# Patient Record
Sex: Male | Born: 1950
Health system: Southern US, Community
[De-identification: ages and names within clinical notes are randomized; demographics above are authoritative.]

## PROBLEM LIST (undated history)

## (undated) DIAGNOSIS — E785 Hyperlipidemia, unspecified: Secondary | ICD-10-CM

## (undated) DIAGNOSIS — K5190 Ulcerative colitis, unspecified, without complications: Secondary | ICD-10-CM

## (undated) DIAGNOSIS — I10 Essential (primary) hypertension: Secondary | ICD-10-CM

## (undated) DIAGNOSIS — T7840XA Allergy, unspecified, initial encounter: Secondary | ICD-10-CM

## (undated) HISTORY — DX: Ulcerative colitis, unspecified, without complications: K51.90

## (undated) HISTORY — DX: Allergy, unspecified, initial encounter: T78.40XA

## (undated) HISTORY — DX: Hyperlipidemia, unspecified: E78.5

---

## 2011-08-22 ENCOUNTER — Emergency Department
Admission: EM | Admit: 2011-08-22 | Discharge: 2011-08-22 | Disposition: A | Discharge status: Home / Self Care | Payer: BC Managed Care – PPO | Source: Home / Self Care | Attending: Emergency Medicine | Admitting: Emergency Medicine

## 2011-08-22 ENCOUNTER — Encounter: Payer: Self-pay | Admitting: *Deleted

## 2011-08-22 DIAGNOSIS — J069 Acute upper respiratory infection, unspecified: Secondary | ICD-10-CM

## 2011-08-22 DIAGNOSIS — J329 Chronic sinusitis, unspecified: Secondary | ICD-10-CM

## 2011-08-22 MED ORDER — AMOXICILLIN-POT CLAVULANATE 875-125 MG PO TABS: 1.0000 | ORAL_TABLET | Freq: Two times a day (BID) | ORAL | Status: AC

## 2011-08-22 NOTE — ED Notes (Signed)
Patient c/o sinus pain, drainage and productive cough. Taken Advil cold and sinus and Claritin. Denies fever.

## 2011-08-22 NOTE — ED Provider Notes (Signed)
History     CSN: [id number]  Arrival date & time 08/22/11  4098   First MD Initiated Contact with Patient 08/22/11 0945      Chief Complaint  Patient presents with  . Facial Pain  . Cough    (Consider location/radiation/quality/duration/timing/severity/associated sxs/prior treatment) HPI Daniel Baldwin is a 61 y.o. male who complains of onset of cold symptoms for 7 days.  The symptoms are constant and mild-moderate in severity. No sore throat +cough No pleuritic pain No wheezing +nasal congestion + post-nasal drainage + sinus pain/pressure No chest congestion No itchy/red eyes No earache No hemoptysis No SOB No chills/sweats No fever No nausea No vomiting No abdominal pain No diarrhea No skin rashes No fatigue No myalgias +headache    History reviewed. No pertinent past medical history.  History reviewed. No pertinent past surgical history.  Family History  Problem Relation Age of Onset  . Hypertension Mother   . Hypertension Father     History  Substance Use Topics  . Smoking status: Former Smoker    Quit date: 08/21/2001  . Smokeless tobacco: Not on file  . Alcohol Use: Yes      Review of Systems  All other systems reviewed and are negative.    Allergies  Pollen extract  Home Medications   Current Outpatient Rx  Name Route Sig Dispense Refill  . ASPIRIN 81 MG PO TABS Oral Take 81 mg by mouth daily.    . AMOXICILLIN-POT CLAVULANATE 875-125 MG PO TABS Oral Take 1 tablet by mouth 2 (two) times daily. 16 tablet 0    BP 151/93  Pulse 94  Temp(Src) 98.7 F (37.1 C) (Oral)  Resp 16  Ht 5\' 10"  (1.778 m)  Wt 174 lb (78.926 kg)  BMI 24.97 kg/m2  SpO2 97%  Physical Exam  Nursing note and vitals reviewed. Constitutional: He is oriented to person, place, and time. He appears well-developed and well-nourished.  HENT:  Head: Normocephalic and atraumatic.  Right Ear: Tympanic membrane, external ear and ear canal normal.  Left Ear: Tympanic  membrane, external ear and ear canal normal.  Nose: Mucosal edema and rhinorrhea present.  Mouth/Throat: Posterior oropharyngeal erythema present. No oropharyngeal exudate or posterior oropharyngeal edema.  Eyes: No scleral icterus.  Neck: Neck supple.  Cardiovascular: Regular rhythm and normal heart sounds.   Pulmonary/Chest: Effort normal and breath sounds normal. No respiratory distress.  Neurological: He is alert and oriented to person, place, and time.  Skin: Skin is warm and dry.  Psychiatric: He has a normal mood and affect. His speech is normal.    ED Course  Procedures (including critical care time)  Labs Reviewed - No data to display No results found.   1. Acute upper respiratory infections of unspecified site   2. Sinusitis       MDM  1)  Take the prescribed antibiotic as instructed. 2)  Use nasal saline solution (over the counter) at least 3 times a day. 3)  Use over the counter decongestants like Zyrtec-D every 12 hours as needed to help with congestion.  If you have hypertension, do not take medicines with sudafed.  4)  Can take tylenol every 6 hours or motrin every 8 hours for pain or fever. 5)  Follow up with your primary doctor if no improvement in 5-7 days, sooner if increasing pain, fever, or new symptoms.     Marlaine Hind, MD 08/22/11 1005

## 2014-01-20 ENCOUNTER — Emergency Department (INDEPENDENT_AMBULATORY_CARE_PROVIDER_SITE_OTHER)
Admission: EM | Admit: 2014-01-20 | Discharge: 2014-01-20 | Disposition: A | Discharge status: Home / Self Care | Payer: BC Managed Care – PPO | Source: Home / Self Care

## 2014-01-20 ENCOUNTER — Encounter: Payer: Self-pay | Admitting: Emergency Medicine

## 2014-01-20 DIAGNOSIS — Z23 Encounter for immunization: Secondary | ICD-10-CM

## 2014-01-20 MED ORDER — INFLUENZA VAC SPLIT QUAD 0.5 ML IM SUSY: 0.5000 mL | PREFILLED_SYRINGE | INTRAMUSCULAR | Status: DC

## 2014-01-20 MED ORDER — INFLUENZA VAC SPLIT QUAD 0.5 ML IM SUSY
0.5000 mL | PREFILLED_SYRINGE | Freq: Once | INTRAMUSCULAR | Status: AC
  Administered 2014-01-20: 0.5 mL via INTRAMUSCULAR

## 2014-01-20 NOTE — ED Notes (Signed)
Pt is here for influenza vaccine. BP 174/111 and 177/118 but asymptomatic. Checked with Dr. Burnett Harry to ok vaccine administration. Advised not a contraindication. Made apt for him to establish care with Dr. Ileene Rubens tomorrow, 01/21/14 @ 1:15pm. Pt given apt time and location, verbalized understanding.

## 2014-01-21 ENCOUNTER — Ambulatory Visit (INDEPENDENT_AMBULATORY_CARE_PROVIDER_SITE_OTHER): Payer: BC Managed Care – PPO | Admitting: Family Medicine

## 2014-01-21 ENCOUNTER — Encounter: Payer: Self-pay | Admitting: Family Medicine

## 2014-01-21 VITALS — BP 169/106 | HR 82 | Ht 70.0 in | Wt 174.0 lb

## 2014-01-21 DIAGNOSIS — I10 Essential (primary) hypertension: Secondary | ICD-10-CM | POA: Insufficient documentation

## 2014-01-21 DIAGNOSIS — G5621 Lesion of ulnar nerve, right upper limb: Secondary | ICD-10-CM | POA: Insufficient documentation

## 2014-01-21 DIAGNOSIS — E785 Hyperlipidemia, unspecified: Secondary | ICD-10-CM | POA: Diagnosis not present

## 2014-01-21 HISTORY — DX: Hyperlipidemia, unspecified: E78.5

## 2014-01-21 NOTE — Progress Notes (Signed)
CC: Daniel Baldwin is a 63 y.o. male is here for Establish Care and Hypertension   Subjective: HPI:  Very pleasant 63 year old here to establish care with very limited past medical history.  Patient reports being told that his blood pressure was elevated earlier this week when he presented to our urgent care Center for a flu shot. Blood pressure was stage II hypertension he's never been told that blood pressure has ever been elevated in the past. Most recent blood pressure he can report beyond this was 2013 at a health fair at his job he was in the pre-hypertensive range. No formal exercise routine, 2 drinks of alcohol daily and currently no tobacco use.  He began exercising daily he found that his blood pressure was elevated with light jogging and walking briskly for 30 minutes without any chest pain headache nor motor or sensory disturbances. Strong family history of hypertension both sides of the family. He does not know his sodium intake.  Complains of right arm pain localized from the elbow distally involving the entire superficial aspect of the arm described as "hard to define"pain that is only noticed when resting his right elbow on a desk or fixed objects. Pain began the day after he was putting together cabnets in his garage requiring hours of manual screwing with his right hand. He estimates this occurred 2-3 months ago and pain has been mild but persistent ever since. He denies any overlying skin changes, weakness, nor focal pain. Nothing particularly makes the symptoms better. He's never had this before. He denies shoulder pain or neck pain  He brings in lipids from 2013 with an LDL of 183 he has never been on cholesterol medication  Review of Systems - General ROS: negative for - chills, fever, night sweats, weight gain or weight loss Ophthalmic ROS: negative for - decreased vision Psychological ROS: negative for - anxiety or depression ENT ROS: negative for - hearing change, nasal  congestion, tinnitus or allergies Hematological and Lymphatic ROS: negative for - bleeding problems, bruising or swollen lymph nodes Breast ROS: negative Respiratory ROS: no cough, shortness of breath, or wheezing Cardiovascular ROS: no chest pain or dyspnea on exertion Gastrointestinal ROS: no abdominal pain, change in bowel habits, or black or bloody stools Genito-Urinary ROS: negative for - genital discharge, genital ulcers, incontinence or abnormal bleeding from genitals Musculoskeletal ROS: negative for - joint pain or muscle pain other than that described above Neurological ROS: negative for - headaches or memory loss Dermatological ROS: negative for lumps, mole changes, rash and skin lesion changes  Past Medical History  Diagnosis Date  . Hyperlipidemia 01/21/2014    History reviewed. No pertinent past surgical history. Family History  Problem Relation Age of Onset  . Hypertension Mother   . Hypertension Father     History   Social History  . Marital Status: Married    Spouse Name: N/A    Number of Children: N/A  . Years of Education: N/A   Occupational History  . Not on file.   Social History Main Topics  . Smoking status: Former Smoker    Quit date: 08/21/2001  . Smokeless tobacco: Not on file  . Alcohol Use: Yes  . Drug Use: Not on file  . Sexual Activity: Not on file   Other Topics Concern  . Not on file   Social History Narrative  . No narrative on file     Objective: BP 169/106  Pulse 82  Ht 5\' 10"  (1.778 m)  Wt  174 lb (78.926 kg)  BMI 24.97 kg/m2  General: Alert and Oriented, No Acute Distress HEENT: Pupils equal, round, reactive to light. Conjunctivae clear.  Moist membranes pharynx unremarkable Lungs: Clear to auscultation bilaterally, no wheezing/ronchi/rales.  Comfortable work of breathing. Good air movement. Cardiac: Regular rate and rhythm. Normal S1/S2.  No murmurs, rubs, nor gallops. No carotid bruits  Extremities: No peripheral edema.   Strong peripheral pulses. Full range of motion and strength in the right upper extremity from the shoulder distally. C5 DTRs 2/5 bilaterally and symmetric. Examination of the right elbow shows no medial or lateral epicondylar pain with palpation. Resisted right wrist extension and flexion in elbow extension does not reproduce pain. No pain with resisted supination or pronation of the right forearm. Mental Status: No depression, anxiety, nor agitation. Skin: Warm and dry.  Assessment & Plan: Endi was seen today for establish care and hypertension.  Diagnoses and associated orders for this visit:  Essential hypertension  Ulnar neuropathy of right upper extremity  Hyperlipidemia    Essential hypertension: Uncontrolled, discussed new formal diagnosis.  He is extremely apprehensive about taking medications for this.  He wants to aggressively focus on dietary and exercise interventions for the time being. We discussed thedash diet, sodium restriction to below 2000 mg daily, moderate exercise most days of the week described as 30 minutes of cardio at a exertion that he could still keep a conversation at. He's convinced that he has white coat hypertension therefore asked him to buy a blood pressure cuff and keep readings over the next 2 weeks and bring these numbers with his blood pressure cuff at his next visit in 2 weeks Right ulnar neuropathy, discussed home rehabilitative exercises for cubital tunnel syndrome to be performed on a daily basis for the next 4 weeks. If no improvement will refer for consideration of nerve conduction studies. Hyperlipidemia: Encouraged returning in for complete physical exam at that time we will obtain fasting lipids with a collection of other labs  Return in about 2 weeks (around 02/04/2014).

## 2014-01-21 NOTE — Patient Instructions (Signed)
DASH Eating Plan °DASH stands for "Dietary Approaches to Stop Hypertension." The DASH eating plan is a healthy eating plan that has been shown to reduce high blood pressure (hypertension). Additional health benefits may include reducing the risk of type 2 diabetes mellitus, heart disease, and stroke. The DASH eating plan may also help with weight loss. °WHAT DO I NEED TO KNOW ABOUT THE DASH EATING PLAN? °For the DASH eating plan, you will follow these general guidelines: °· Choose foods with a percent daily value for sodium of less than 5% (as listed on the food label). °· Use salt-free seasonings or herbs instead of table salt or sea salt. °· Check with your health care provider or pharmacist before using salt substitutes. °· Eat lower-sodium products, often labeled as "lower sodium" or "no salt added." °· Eat fresh foods. °· Eat more vegetables, fruits, and low-fat dairy products. °· Choose whole grains. Look for the word "whole" as the first word in the ingredient list. °· Choose fish and skinless chicken or turkey more often than red meat. Limit fish, poultry, and meat to 6 oz (170 g) each day. °· Limit sweets, desserts, sugars, and sugary drinks. °· Choose heart-healthy fats. °· Limit cheese to 1 oz (28 g) per day. °· Eat more home-cooked food and less restaurant, buffet, and fast food. °· Limit fried foods. °· Cook foods using methods other than frying. °· Limit canned vegetables. If you do use them, rinse them well to decrease the sodium. °· When eating at a restaurant, ask that your food be prepared with less salt, or no salt if possible. °WHAT FOODS CAN I EAT? °Seek help from a dietitian for individual calorie needs. °Grains °Whole grain or whole wheat bread. Brown rice. Whole grain or whole wheat pasta. Quinoa, bulgur, and whole grain cereals. Low-sodium cereals. Corn or whole wheat flour tortillas. Whole grain cornbread. Whole grain crackers. Low-sodium crackers. °Vegetables °Fresh or frozen vegetables  (raw, steamed, roasted, or grilled). Low-sodium or reduced-sodium tomato and vegetable juices. Low-sodium or reduced-sodium tomato sauce and paste. Low-sodium or reduced-sodium canned vegetables.  °Fruits °All fresh, canned (in natural juice), or frozen fruits. °Meat and Other Protein Products °Ground beef (85% or leaner), grass-fed beef, or beef trimmed of fat. Skinless chicken or turkey. Ground chicken or turkey. Pork trimmed of fat. All fish and seafood. Eggs. Dried beans, peas, or lentils. Unsalted nuts and seeds. Unsalted canned beans. °Dairy °Low-fat dairy products, such as skim or 1% milk, 2% or reduced-fat cheeses, low-fat ricotta or cottage cheese, or plain low-fat yogurt. Low-sodium or reduced-sodium cheeses. °Fats and Oils °Tub margarines without trans fats. Light or reduced-fat mayonnaise and salad dressings (reduced sodium). Avocado. Safflower, olive, or canola oils. Natural peanut or almond butter. °Other °Unsalted popcorn and pretzels. °The items listed above may not be a complete list of recommended foods or beverages. Contact your dietitian for more options. °WHAT FOODS ARE NOT RECOMMENDED? °Grains °White bread. White pasta. White rice. Refined cornbread. Bagels and croissants. Crackers that contain trans fat. °Vegetables °Creamed or fried vegetables. Vegetables in a cheese sauce. Regular canned vegetables. Regular canned tomato sauce and paste. Regular tomato and vegetable juices. °Fruits °Dried fruits. Canned fruit in light or heavy syrup. Fruit juice. °Meat and Other Protein Products °Fatty cuts of meat. Ribs, chicken wings, bacon, sausage, bologna, salami, chitterlings, fatback, hot dogs, bratwurst, and packaged luncheon meats. Salted nuts and seeds. Canned beans with salt. °Dairy °Whole or 2% milk, cream, half-and-half, and cream cheese. Whole-fat or sweetened yogurt. Full-fat   cheeses or blue cheese. Nondairy creamers and whipped toppings. Processed cheese, cheese spreads, or cheese  curds. °Condiments °Onion and garlic salt, seasoned salt, table salt, and sea salt. Canned and packaged gravies. Worcestershire sauce. Tartar sauce. Barbecue sauce. Teriyaki sauce. Soy sauce, including reduced sodium. Steak sauce. Fish sauce. Oyster sauce. Cocktail sauce. Horseradish. Ketchup and mustard. Meat flavorings and tenderizers. Bouillon cubes. Hot sauce. Tabasco sauce. Marinades. Taco seasonings. Relishes. °Fats and Oils °Butter, stick margarine, lard, shortening, ghee, and bacon fat. Coconut, palm kernel, or palm oils. Regular salad dressings. °Other °Pickles and olives. Salted popcorn and pretzels. °The items listed above may not be a complete list of foods and beverages to avoid. Contact your dietitian for more information. °WHERE CAN I FIND MORE INFORMATION? °[employer], [employer], [employer]: www.nhlbi.nih.gov/health/health-topics/topics/dash/ °Document Released: 03/16/2011 Document Revised: 08/11/2013 Document Reviewed: 01/29/2013 °Exit[employer]® Patient Information ©2015 [employer]. This information is not intended to replace advice given to you by your health care provider. Make sure you discuss any questions you have with your health care provider. ° °

## 2014-02-03 ENCOUNTER — Encounter: Payer: Self-pay | Admitting: Family Medicine

## 2014-03-11 ENCOUNTER — Telehealth: Payer: Self-pay | Admitting: Family Medicine

## 2014-03-11 NOTE — Telephone Encounter (Signed)
Pt.notified

## 2014-03-11 NOTE — Telephone Encounter (Signed)
Daniel Baldwin, You please think the patient for bringing in his blood pressure diary. I'm very impressed with his ability to reduce his blood pressure and for all of November it's been in the normal range. There is no reason for blood pressure medication at this point as long as he can keep up the good work. Follow-up in 3 months.

## 2014-03-12 ENCOUNTER — Ambulatory Visit (INDEPENDENT_AMBULATORY_CARE_PROVIDER_SITE_OTHER): Payer: BLUE CROSS/BLUE SHIELD | Admitting: Family Medicine

## 2014-03-12 ENCOUNTER — Encounter: Payer: Self-pay | Admitting: Family Medicine

## 2014-03-12 VITALS — BP 165/102 | HR 84 | Temp 98.2°F

## 2014-03-12 DIAGNOSIS — Z23 Encounter for immunization: Secondary | ICD-10-CM | POA: Diagnosis not present

## 2014-03-12 NOTE — Progress Notes (Signed)
   Subjective:    Patient ID: Daniel Baldwin, male    DOB: 24-Jan-1951, 63 y.o.   MRN: [id number]  HPI  Daniel Baldwin reports today for Tdap and Shingles vaccines. Denies recent illness or fever. He was counseled on possible side effects and verbalized understanding. His BP was elevated once again and he said that it is always like that and he is working on lowering naturally himself. He denies CP, SOB, abdominal pain, dizziness or headache. Margette Fast, CMA    Review of Systems     Objective:   Physical Exam        Assessment & Plan:

## 2014-08-07 ENCOUNTER — Emergency Department
Admission: EM | Admit: 2014-08-07 | Discharge: 2014-08-07 | Disposition: A | Discharge status: Home / Self Care | Payer: BLUE CROSS/BLUE SHIELD | Source: Home / Self Care | Attending: Family Medicine | Admitting: Family Medicine

## 2014-08-07 ENCOUNTER — Encounter: Payer: Self-pay | Admitting: *Deleted

## 2014-08-07 DIAGNOSIS — W5501XA Bitten by cat, initial encounter: Secondary | ICD-10-CM | POA: Diagnosis not present

## 2014-08-07 DIAGNOSIS — L03116 Cellulitis of left lower limb: Secondary | ICD-10-CM

## 2014-08-07 HISTORY — DX: Essential (primary) hypertension: I10

## 2014-08-07 MED ORDER — AMOXICILLIN-POT CLAVULANATE 875-125 MG PO TABS: 1.0000 | ORAL_TABLET | Freq: Two times a day (BID) | ORAL | Status: DC

## 2014-08-07 NOTE — ED Provider Notes (Signed)
CSN: [id number]     Arrival date & time 08/07/14  1058 History   First MD Initiated Contact with Patient 08/07/14 1134     Chief Complaint  Patient presents with  . cat scratch       HPI Comments: Patient's two male cats were fighting 10 days ago, and bit him on his left pretibial area and right posterior thigh above the knee.  Cats were adequately immunized and not ill. Patient has had a persistent draining lesion on left lower leg.  Bite on his right posterior calf has remained raised and nodular but is nontender.  He feels well otherwise.  No fevers, chills, and sweats.  His Tdap is current.  Patient is a 65 y.o. male presenting with animal bite. The history is provided by the patient.  Animal Bite Contact animal:  Cat Animal bite location: left pretibial area and right posterior calf. Time since incident:  10 days Pain details:    Quality:  Aching   Severity:  Mild   Progression:  Improving Provoked: unprovoked   Animal's rabies vaccination status:  Up to date Animal in possession: yes   Tetanus status:  Up to date Relieved by:  None tried Worsened by:  Nothing tried Ineffective treatments:  None tried Associated symptoms: swelling   Associated symptoms: no fever, no numbness and no rash     Past Medical History  Diagnosis Date  . Hyperlipidemia 01/21/2014  . Hypertension    History reviewed. No pertinent past surgical history. Family History  Problem Relation Age of Onset  . Hypertension Mother   . Hypertension Father    History  Substance Use Topics  . Smoking status: Former Smoker    Quit date: 08/21/2001  . Smokeless tobacco: Not on file  . Alcohol Use: Yes    Review of Systems  Constitutional: Negative for fever.  Skin: Negative for rash.  Neurological: Negative for numbness.  All other systems reviewed and are negative.   Allergies  Pollen extract  Home Medications   Prior to Admission medications   Medication Sig Start Date End Date Taking?  Authorizing Provider  aspirin 81 MG tablet Take 81 mg by mouth daily.   Yes Historical Provider, MD  amoxicillin-clavulanate (AUGMENTIN) 875-125 MG per tablet Take 1 tablet by mouth 2 (two) times daily. Take with food 08/07/14   Kandra Nicolas, MD   BP 181/98 mmHg  Pulse 84  Temp(Src) 98.2 F (36.8 C) (Oral)  Resp 14  Wt 174 lb (78.926 kg)  SpO2 99% Physical Exam  Constitutional: He is oriented to person, place, and time. He appears well-developed and well-nourished. No distress.  HENT:  Head: Normocephalic.  Eyes: Pupils are equal, round, and reactive to light.  Musculoskeletal:       Left lower leg: He exhibits tenderness.       Legs: Left pretibial area has a 28mm puncture wound with small amount of bloody drainage present, surrounded by erythema with a diameter 5cm.  The area is not fluctuant, and has minimal tenderness.  The right posterior thigh has a 20mm dia open wound without drainage.  The surrounding area is raised and nodular, about 2cm diameter, but nontender.  Neurological: He is alert and oriented to person, place, and time.  Skin: Skin is warm and dry.  Nursing note and vitals reviewed.   ED Course  Procedures  None   Labs Reviewed  WOUND CULTURE  WOUND CULTURE         MDM  1. Cat bite, initial encounter; nodularity of lesion right posterior thigh may represent healing hematoma.   2. Cellulitis of left lower leg    Wound culture pending from both sites Begin Augmentin 875mg  BID to cover pasteurella multocida. Keep wounds bandaged and change dressings daily until healed. Followup with Family Doctor if not improved in one week.     Kandra Nicolas, MD 08/07/14 1159

## 2014-08-07 NOTE — Discharge Instructions (Signed)
Keep wounds bandaged and change dressings daily until healed.   Animal Bite An animal bite can result in a scratch on the skin, deep open cut, puncture of the skin, crush injury, or tearing away of the skin or a body part. Dogs are responsible for most animal bites. Children are bitten more often than adults. An animal bite can range from very mild to more serious. A small bite from your house pet is no cause for alarm. However, some animal bites can become infected or injure a bone or other tissue. You must seek medical care if:  The skin is broken and bleeding does not slow down or stop after 15 minutes.  The puncture is deep and difficult to clean (such as a cat bite).  Pain, warmth, redness, or pus develops around the wound.  The bite is from a stray animal or rodent. There may be a risk of rabies infection.  The bite is from a snake, raccoon, skunk, fox, coyote, or bat. There may be a risk of rabies infection.  The person bitten has a chronic illness such as diabetes, liver disease, or cancer, or the person takes medicine that lowers the immune system.  There is concern about the location and severity of the bite. It is important to clean and protect an animal bite wound right away to prevent infection. Follow these steps:  Clean the wound with plenty of water and soap.  Apply an antibiotic cream.  Apply gentle pressure over the wound with a clean towel or gauze to slow or stop bleeding.  Elevate the affected area above the heart to help stop any bleeding.  Seek medical care. Getting medical care within 8 hours of the animal bite leads to the best possible outcome. DIAGNOSIS  Your caregiver will most likely:  Take a detailed history of the animal and the bite injury.  Perform a wound exam.  Take your medical history. Blood tests or X-rays may be performed. Sometimes, infected bite wounds are cultured and sent to a lab to identify the infectious bacteria.  TREATMENT    Medical treatment will depend on the location and type of animal bite as well as the patient's medical history. Treatment may include:  Wound care, such as cleaning and flushing the wound with saline solution, bandaging, and elevating the affected area.  Antibiotics.  Tetanus immunization.  Rabies immunization.  Leaving the wound open to heal. This is often done with animal bites, due to the high risk of infection. However, in certain cases, wound closure with stitches, wound adhesive, skin adhesive strips, or staples may be used. Infected bites that are left untreated may require intravenous (IV) antibiotics and surgical treatment in the hospital. [place]  Follow your caregiver's instructions for wound care.  Take all medicines as directed.  If your caregiver prescribes antibiotics, take them as directed. Finish them even if you start to feel better.  Follow up with your caregiver for further exams or immunizations as directed. You may need a tetanus shot if:  You cannot remember when you had your last tetanus shot.  You have never had a tetanus shot.  The injury broke your skin. If you get a tetanus shot, your arm may swell, get red, and feel warm to the touch. This is common and not a problem. If you need a tetanus shot and you choose not to have one, there is a rare chance of getting tetanus. Sickness from tetanus can be serious. SEEK MEDICAL CARE IF:  You notice warmth, redness, soreness, swelling, pus discharge, or a bad smell coming from the wound.  You have a red line on the skin coming from the wound.  You have a fever, chills, or a general ill feeling.  You have nausea or vomiting.  You have continued or worsening pain.  You have trouble moving the injured part.  You have other questions or concerns. MAKE SURE YOU:  Understand these instructions.  Will watch your condition.  Will get help right away if you are not doing well or get  worse. Document Released: 12/13/2010 Document Revised: 06/19/2011 Document Reviewed: 12/13/2010 [facility] Patient Information 2015 [place], [place]. This information is not intended to replace advice given to you by your health care provider. Make sure you discuss any questions you have with your health care provider.   Cellulitis Cellulitis is an infection of the skin and the tissue beneath it. The infected area is usually red and tender. Cellulitis occurs most often in the arms and lower legs.  CAUSES  Cellulitis is caused by bacteria that enter the skin through cracks or cuts in the skin. The most common types of bacteria that cause cellulitis are staphylococci and streptococci. SIGNS AND SYMPTOMS   Redness and warmth.  Swelling.  Tenderness or pain.  Fever. DIAGNOSIS  Your health care provider can usually determine what is wrong based on a physical exam. Blood tests may also be done. TREATMENT  Treatment usually involves taking an antibiotic medicine. HOME CARE INSTRUCTIONS   Take your antibiotic medicine as directed by your health care provider. Finish the antibiotic even if you start to feel better.  Keep the infected arm or leg elevated to reduce swelling.  Apply a warm cloth to the affected area up to 4 times per day to relieve pain.  Take medicines only as directed by your health care provider.  Keep all follow-up visits as directed by your health care provider. SEEK MEDICAL CARE IF:   You notice red streaks coming from the infected area.  Your red area gets larger or turns dark in color.  Your bone or joint underneath the infected area becomes painful after the skin has healed.  Your infection returns in the same area or another area.  You notice a swollen bump in the infected area.  You develop new symptoms.  You have a fever. SEEK IMMEDIATE MEDICAL CARE IF:   You feel very sleepy.  You develop vomiting or diarrhea.  You have a general ill feeling  (malaise) with muscle aches and pains. MAKE SURE YOU:   Understand these instructions.  Will watch your condition.  Will get help right away if you are not doing well or get worse. Document Released: 01/04/2005 Document Revised: 08/11/2013 Document Reviewed: 06/12/2011 [facility] Patient Information 2015 [place], [place]. This information is not intended to replace advice given to you by your health care provider. Make sure you discuss any questions you have with your health care provider.

## 2014-08-07 NOTE — ED Notes (Addendum)
Pts own cat scratched him on his left lower leg and posterior right upper thigh 10 days ago. Reports cat is UTD on all vaccines. He denies pain and is asymptomatic. Pts last tetanus vaccine 03/12/14

## 2014-08-09 ENCOUNTER — Telehealth: Payer: Self-pay | Admitting: Emergency Medicine

## 2014-08-09 NOTE — ED Notes (Signed)
Inquired about patient's status; encourage them to call with questions/concerns.  

## 2014-08-10 LAB — WOUND CULTURE
GRAM STAIN: NONE SEEN
GRAM STAIN: NONE SEEN
Gram Stain: NONE SEEN
Gram Stain: NONE SEEN
Gram Stain: NONE SEEN
ORGANISM ID, BACTERIA: NO GROWTH

## 2016-03-28 ENCOUNTER — Encounter: Payer: Self-pay | Admitting: Physician Assistant

## 2016-03-28 ENCOUNTER — Ambulatory Visit (INDEPENDENT_AMBULATORY_CARE_PROVIDER_SITE_OTHER): Payer: BLUE CROSS/BLUE SHIELD | Admitting: Physician Assistant

## 2016-03-28 VITALS — BP 169/104 | HR 77 | Ht 70.0 in | Wt 175.0 lb

## 2016-03-28 DIAGNOSIS — Z131 Encounter for screening for diabetes mellitus: Secondary | ICD-10-CM

## 2016-03-28 DIAGNOSIS — K921 Melena: Secondary | ICD-10-CM | POA: Diagnosis not present

## 2016-03-28 DIAGNOSIS — I10 Essential (primary) hypertension: Secondary | ICD-10-CM | POA: Diagnosis not present

## 2016-03-28 DIAGNOSIS — Z13220 Encounter for screening for lipoid disorders: Secondary | ICD-10-CM

## 2016-03-28 DIAGNOSIS — R197 Diarrhea, unspecified: Secondary | ICD-10-CM

## 2016-03-28 MED ORDER — LISINOPRIL 10 MG PO TABS: 10.0000 mg | ORAL_TABLET | Freq: Every day | ORAL | 0 refills | Status: DC

## 2016-03-28 NOTE — Patient Instructions (Addendum)
Will get scheduled for colonscopy.  Follow up BP in 1 month- nurse visit.   Lisinopril tablets What is this medicine? LISINOPRIL (lyse IN oh pril) is an ACE inhibitor. This medicine is used to treat high blood pressure and heart failure. It is also used to protect the heart immediately after a heart attack. This medicine may be used for other purposes; ask your health care provider or pharmacist if you have questions. COMMON BRAND NAME(S): Prinivil, Zestril What should I tell my health care provider before I take this medicine? They need to know if you have any of these conditions: -diabetes -heart or blood vessel disease -kidney disease -low blood pressure -previous swelling of the tongue, face, or lips with difficulty breathing, difficulty swallowing, hoarseness, or tightening of the throat -an unusual or allergic reaction to lisinopril, other ACE inhibitors, insect venom, foods, dyes, or preservatives -pregnant or trying to get pregnant -breast-feeding How should I use this medicine? Take this medicine by mouth with a glass of water. Follow the directions on your prescription label. You may take this medicine with or without food. If it upsets your stomach, take it with food. Take your medicine at regular intervals. Do not take it more often than directed. Do not stop taking except on your doctor's advice. Talk to your pediatrician regarding the use of this medicine in children. Special care may be needed. While this drug may be prescribed for children as young as 82 years of age for selected conditions, precautions do apply. Overdosage: If you think you have taken too much of this medicine contact a poison control center or emergency room at once. NOTE: This medicine is only for you. Do not share this medicine with others. What if I miss a dose? If you miss a dose, take it as soon as you can. If it is almost time for your next dose, take only that dose. Do not take double or extra  doses. What may interact with this medicine? Do not take this medicine with any of the following medications: -hymenoptera venom -sacubitril; valsartan This medicines may also interact with the following medications: -aliskiren -angiotensin receptor blockers, like losartan or valsartan -certain medicines for diabetes -diuretics -everolimus -gold compounds -lithium -NSAIDs, medicines for pain and inflammation, like ibuprofen or naproxen -potassium salts or supplements -salt substitutes -sirolimus -temsirolimus This list may not describe all possible interactions. Give your health care provider a list of all the medicines, herbs, non-prescription drugs, or dietary supplements you use. Also tell them if you smoke, drink alcohol, or use illegal drugs. Some items may interact with your medicine. What should I watch for while using this medicine? Visit your doctor or health care professional for regular check ups. Check your blood pressure as directed. Ask your doctor what your blood pressure should be, and when you should contact him or her. Do not treat yourself for coughs, colds, or pain while you are using this medicine without asking your doctor or health care professional for advice. Some ingredients may increase your blood pressure. Women should inform their doctor if they wish to become pregnant or think they might be pregnant. There is a potential for serious side effects to an unborn child. Talk to your health care professional or pharmacist for more information. Check with your doctor or health care professional if you get an attack of severe diarrhea, nausea and vomiting, or if you sweat a lot. The loss of too much body fluid can make it dangerous for you to take  this medicine. You may get drowsy or dizzy. Do not drive, use machinery, or do anything that needs mental alertness until you know how this drug affects you. Do not stand or sit up quickly, especially if you are an older  patient. This reduces the risk of dizzy or fainting spells. Alcohol can make you more drowsy and dizzy. Avoid alcoholic drinks. Avoid salt substitutes unless you are told otherwise by your doctor or health care professional. What side effects may I notice from receiving this medicine? Side effects that you should report to your doctor or health care professional as soon as possible: -allergic reactions like skin rash, itching or hives, swelling of the hands, feet, face, lips, throat, or tongue -breathing problems -signs and symptoms of kidney injury like trouble passing urine or change in the amount of urine -signs and symptoms of increased potassium like muscle weakness; chest pain; or fast, irregular heartbeat -signs and symptoms of liver injury like dark yellow or brown urine; general ill feeling or flu-like symptoms; light-colored stools; loss of appetite; nausea; right upper belly pain; unusually weak or tired; yellowing of the eyes or skin -signs and symptoms of low blood pressure like dizziness; feeling faint or lightheaded, falls; unusually weak or tired -stomach pain with or without nausea and vomiting Side effects that usually do not require medical attention (report to your doctor or health care professional if they continue or are bothersome): -changes in taste -cough -dizziness -fever -headache -sensitivity to light This list may not describe all possible side effects. Call your doctor for medical advice about side effects. You may report side effects to FDA at 1-800-FDA-1088. Where should I keep my medicine? Keep out of the reach of children. Store at room temperature between 15 and 30 degrees C (59 and 86 degrees F). Protect from moisture. Keep container tightly closed. Throw away any unused medicine after the expiration date. NOTE: This sheet is a summary. It may not cover all possible information. If you have questions about this medicine, talk to your doctor, pharmacist, or  health care provider.  2017 Elsevier/Gold Standard (2015-05-17 12:52:35)

## 2016-03-28 NOTE — Progress Notes (Signed)
   Subjective:    Patient ID: Daniel Baldwin, male    DOB: [birth year], 65 y.o.   MRN: [id number]  HPI  Pt is a 65 yo male who presents to the clinic to establish care after PCP left clinic.   .. Active Ambulatory Problems    Diagnosis Date Noted  . Essential hypertension 01/21/2014  . Ulnar neuropathy of right upper extremity 01/21/2014  . Hyperlipidemia 01/21/2014  . Diarrhea 03/29/2016  . Blood in stool 03/29/2016   Resolved Ambulatory Problems    Diagnosis Date Noted  . No Resolved Ambulatory Problems   Past Medical History:  Diagnosis Date  . Hyperlipidemia 01/21/2014  . Hypertension    .[name] Family History  Problem Relation Age of Onset  . Hypertension Mother   . Hypertension Father    Pt is concerned because he has had 2 months of episodic diarrhea and bright red blood. He has never had colonoscopy. Denies any straining or pain with bowel movements. He is having on average 3-5 bowel movements a day. When having the bowel movements she does have some mild abdominal pain. NO family hx of colon cancer, UC, or chrons.   Pt has tried to control BP with diet and OTC medications. He denies any CP, palptiations, headaches, dizziness.    Review of Systems  All other systems reviewed and are negative.      Objective:   Physical Exam  Constitutional: He is oriented to person, place, and time. He appears well-developed and well-nourished.  HENT:  Head: Normocephalic and atraumatic.  Cardiovascular: Normal rate, regular rhythm and normal heart sounds.   Pulmonary/Chest: Effort normal and breath sounds normal.  Abdominal: Soft. Bowel sounds are normal. He exhibits no distension and no mass. There is no tenderness. There is no rebound and no guarding.  Neurological: He is alert and oriented to person, place, and time.  Skin: Skin is warm.          Assessment & Plan:  Marland KitchenMarland KitchenSaadiq was seen today for blood in stools, diarrhea, abdominal pain and hypertension.  Diagnoses and all  orders for this visit:  Essential hypertension -     lisinopril (PRINIVIL,ZESTRIL) 10 MG tablet; Take 1 tablet (10 mg total) by mouth daily.  Screening for diabetes mellitus -     COMPLETE METABOLIC PANEL WITH GFR  Screening for lipid disorders -     Lipid panel  Blood in stool -     Ambulatory referral to Gastroenterology  Diarrhea, unspecified type -     Ambulatory referral to Gastroenterology   Will start lisinopril for BP.  Follow up nurse visit in one month.   Sent to GI for colonoscopy for blood in stool and diarrhea.

## 2016-03-29 DIAGNOSIS — K921 Melena: Secondary | ICD-10-CM | POA: Insufficient documentation

## 2016-03-29 DIAGNOSIS — R197 Diarrhea, unspecified: Secondary | ICD-10-CM | POA: Insufficient documentation

## 2016-04-28 ENCOUNTER — Other Ambulatory Visit: Payer: Self-pay | Admitting: Physician Assistant

## 2016-04-28 DIAGNOSIS — I10 Essential (primary) hypertension: Secondary | ICD-10-CM

## 2016-05-02 ENCOUNTER — Ambulatory Visit (INDEPENDENT_AMBULATORY_CARE_PROVIDER_SITE_OTHER): Payer: BLUE CROSS/BLUE SHIELD | Admitting: Physician Assistant

## 2016-05-02 VITALS — BP 153/88 | HR 67 | Wt 178.0 lb

## 2016-05-02 DIAGNOSIS — I10 Essential (primary) hypertension: Secondary | ICD-10-CM

## 2016-05-02 MED ORDER — LISINOPRIL 20 MG PO TABS: 20.0000 mg | ORAL_TABLET | Freq: Every day | ORAL | 1 refills | Status: DC

## 2016-05-02 NOTE — Progress Notes (Signed)
Patient came into clinic today for blood pressure check. At last OV Pt was started on Lisinopril 10mg  daily. Pt's BP was still elevated in office today, as well as at an appointment he went to last week. Per PCP, Pt is to increase to lisinopril 20mg  and return in 2 weeks for recheck BP NV. Verbalized understanding, no further questions.

## 2016-05-12 LAB — HM COLONOSCOPY

## 2016-05-26 ENCOUNTER — Ambulatory Visit (INDEPENDENT_AMBULATORY_CARE_PROVIDER_SITE_OTHER): Payer: BLUE CROSS/BLUE SHIELD | Admitting: Physician Assistant

## 2016-05-26 VITALS — BP 130/76 | HR 76

## 2016-05-26 DIAGNOSIS — Z23 Encounter for immunization: Secondary | ICD-10-CM

## 2016-05-26 DIAGNOSIS — I10 Essential (primary) hypertension: Secondary | ICD-10-CM | POA: Diagnosis not present

## 2016-05-26 MED ORDER — LISINOPRIL 20 MG PO TABS: 20.0000 mg | ORAL_TABLET | Freq: Every day | ORAL | 1 refills | Status: DC

## 2016-05-26 NOTE — Progress Notes (Signed)
Left recommendation on Pt's VM. Callback provided for questions.

## 2016-05-26 NOTE — Progress Notes (Signed)
Patient came into clinic today for BP check. At last OV his lisinopril was increased to 20mg  daily. Pt denies any headaches, dizziness, palpitations, etc. Will route to PCP for review. If Pt is going to stay on this dose, request a 90 day sent to pharmacy. This has been pended to PCP review. While in office Pt decided to get his flu shot. Patient tolerated injection of flu immunization in left deltoid well, with no immediate complications. Patient also reports he had a colonoscopy completed on 05/12/16 at [facility], called and requested record be sent to office. Advised to contact our office with any questions/concerns.

## 2016-06-04 ENCOUNTER — Encounter: Payer: Self-pay | Admitting: Physician Assistant

## 2016-08-24 ENCOUNTER — Emergency Department (INDEPENDENT_AMBULATORY_CARE_PROVIDER_SITE_OTHER)
Admission: EM | Admit: 2016-08-24 | Discharge: 2016-08-24 | Disposition: A | Discharge status: Home / Self Care | Payer: BLUE CROSS/BLUE SHIELD | Source: Home / Self Care | Attending: Family Medicine | Admitting: Family Medicine

## 2016-08-24 ENCOUNTER — Encounter: Payer: Self-pay | Admitting: Emergency Medicine

## 2016-08-24 DIAGNOSIS — L570 Actinic keratosis: Secondary | ICD-10-CM

## 2016-08-24 DIAGNOSIS — J069 Acute upper respiratory infection, unspecified: Secondary | ICD-10-CM

## 2016-08-24 DIAGNOSIS — B9789 Other viral agents as the cause of diseases classified elsewhere: Secondary | ICD-10-CM

## 2016-08-24 MED ORDER — AZITHROMYCIN 250 MG PO TABS: ORAL_TABLET | ORAL | 0 refills | Status: DC

## 2016-08-24 MED ORDER — ACETAMINOPHEN 500 MG PO TABS
1000.0000 mg | ORAL_TABLET | Freq: Once | ORAL | Status: AC
  Administered 2016-08-24: 1000 mg via ORAL

## 2016-08-24 MED ORDER — BENZONATATE 200 MG PO CAPS: ORAL_CAPSULE | ORAL | 0 refills | Status: DC

## 2016-08-24 NOTE — ED Triage Notes (Signed)
Patient awoke today with congestion, cough, headache and body aches. Took sudafed at 0700. Also has spot under right axilla he wants checked out in case tick bite.

## 2016-08-24 NOTE — ED Provider Notes (Signed)
[facility]    CSN: [id number] Arrival date & time: 08/24/16  1406     History   Chief Complaint Chief Complaint  Patient presents with  . Headache  . Nasal Congestion  . Cough  . Rash    HPI Daniel Baldwin is a 66 y.o. male.   Patient presents with two problems: 1)   Last night patient developed typical cold-like symptoms including sinus congestion, headache, fatigue, and myalgias.  Today he developed sore throat and cough.  No fevers, chills, and sweats. 2)  He has discovered a small nodule near his right axilla and wants to ensure that it is not a tick bite.   The history is provided by the patient.    Past Medical History:  Diagnosis Date  . Hyperlipidemia 01/21/2014  . Hypertension     Patient Active Problem List   Diagnosis Date Noted  . Diarrhea 03/29/2016  . Blood in stool 03/29/2016  . Essential hypertension 01/21/2014  . Ulnar neuropathy of right upper extremity 01/21/2014  . Hyperlipidemia 01/21/2014    History reviewed. No pertinent surgical history.     Home Medications    Prior to Admission medications   Medication Sig Start Date End Date Taking? Authorizing Provider  aspirin 81 MG chewable tablet Chew by mouth daily.   Yes [provider]  azithromycin (ZITHROMAX Z-PAK) 250 MG tablet Take 2 tabs today; then begin one tab once daily for 4 more days. (Rx void after 09/01/16) 08/24/16   Kandra Nicolas, MD  benzonatate (TESSALON) 200 MG capsule Take one cap by mouth at bedtime as needed for cough.  May repeat in 4 to 6 hours 08/24/16   Kandra Nicolas, MD  lisinopril (PRINIVIL,ZESTRIL) 20 MG tablet Take 1 tablet (20 mg total) by mouth daily. 05/26/16   Donella Stade, PA-C    Family History Family History  Problem Relation Age of Onset  . Hypertension Mother   . Hypertension Father     Social History Social History  Substance Use Topics  . Smoking status: Former Smoker    Quit date: 08/21/2001  . Smokeless tobacco:  Never Used  . Alcohol use Yes     Allergies   Pollen extract   Review of Systems Review of Systems + mild sore throat + cough No pleuritic pain No wheezing + nasal congestion + post-nasal drainage No sinus pain/pressure No itchy/red eyes No earache No hemoptysis No SOB No fever/chills No nausea No vomiting No abdominal pain No diarrhea No urinary symptoms No skin rash + fatigue + myalgias + headache + skin lesion Used OTC meds without relief    Physical Exam Triage Vital Signs ED Triage Vitals  Enc Vitals Group     BP 08/24/16 1430 116/80     Pulse Rate 08/24/16 1430 (!) 103     Resp 08/24/16 1430 16     Temp 08/24/16 1430 99.2 F (37.3 C)     Temp Source 08/24/16 1430 Oral     SpO2 08/24/16 1430 95 %     Weight --      Height --      Head Circumference --      Peak Flow --      Pain Score 08/24/16 1431 2     Pain Loc --      Pain Edu? --      Excl. in [place]? --    No data found.   Updated Vital Signs BP 116/80 (BP Location: Left  Arm)   Pulse (!) 103   Temp 99.2 F (37.3 C) (Oral)   Resp 16   SpO2 95%   Visual Acuity Right Eye Distance:   Left Eye Distance:   Bilateral Distance:    Right Eye Near:   Left Eye Near:    Bilateral Near:     Physical Exam Nursing notes and Vital Signs reviewed. Appearance:  Patient appears stated age, and in no acute distress Eyes:  Pupils are equal, round, and reactive to light and accomodation.  Extraocular movement is intact.  Conjunctivae are not inflamed  Ears:  Canals normal.  Tympanic membranes normal.  Nose:  Mildly congested turbinates.  No sinus tenderness.   Pharynx:  Normal Neck:  Supple.  Tender enlarged posterior/lateral nodes are palpated bilaterally  Lungs:  Clear to auscultation.  Breath sounds are equal.  Moving air well. Heart:  Regular rate and rhythm without murmurs, rubs, or gallops.  Abdomen:  Nontender without masses or hepatosplenomegaly.  Bowel sounds are present.  No CVA or flank  tenderness.  Extremities:  No edema.  Skin:  No rash present.  On the right upper chest below the axilla is a small 65mm diameter benign appearing nodule whose appearance is consistent with actinic keratosis.  UC Treatments / Results  Labs (all labs ordered are listed, but only abnormal results are displayed) Labs Reviewed - No data to display  EKG  EKG Interpretation None       Radiology No results found.  Procedures Procedures (including critical care time)  Medications Ordered in UC Medications  acetaminophen (TYLENOL) tablet 1,000 mg (1,000 mg Oral Given 08/24/16 1447)     Initial Impression / Assessment and Plan / UC Course  I have reviewed the triage vital signs and the nursing notes.  Pertinent labs & imaging results that were available during my care of the patient were reviewed by me and considered in my medical decision making (see chart for details).    There is no evidence of bacterial infection today.   Prescription written for Benzonatate [facility]) to take at bedtime for night-time cough.  Take plain guaifenesin (1200mg  extended release tabs such as Mucinex) twice daily, with plenty of water, for cough and congestion.  May add Pseudoephedrine (30mg , one or two every 4 to 6 hours) for sinus congestion (if blood pressure is not affected).  Get adequate rest.   May use Afrin nasal spray (or generic oxymetazoline) each morning for about 5 days and then discontinue.  Also recommend using saline nasal spray several times daily and saline nasal irrigation (AYR is a common brand).  Use Flonase nasal spray each morning after using Afrin nasal spray and saline nasal irrigation. Try warm salt water gargles for sore throat.  Stop all antihistamines for now, and other non-prescription cough/cold preparations. Begin Azithromycin if not improving about one week or if persistent fever develops (Given a prescription to hold, with an expiration date)  Follow-up with family doctor  if not improving about 10 days.     Final Clinical Impressions(s) / UC Diagnoses   Final diagnoses:  Viral URI with cough  Actinic keratosis    New Prescriptions Discharge Medication List as of 08/24/2016  3:13 PM    START taking these medications   Details  azithromycin (ZITHROMAX Z-PAK) 250 MG tablet Take 2 tabs today; then begin one tab once daily for 4 more days. (Rx void after 09/01/16), Print    benzonatate (TESSALON) 200 MG capsule Take one cap by mouth  at bedtime as needed for cough.  May repeat in 4 to 6 hours, Print         Kandra Nicolas, MD 09/04/16 [phone]

## 2016-08-24 NOTE — Discharge Instructions (Signed)
Take plain guaifenesin (1200mg  extended release tabs such as Mucinex) twice daily, with plenty of water, for cough and congestion.  May add Pseudoephedrine (30mg , one or two every 4 to 6 hours) for sinus congestion (if blood pressure is not affected).  Get adequate rest.   May use Afrin nasal spray (or generic oxymetazoline) each morning for about 5 days and then discontinue.  Also recommend using saline nasal spray several times daily and saline nasal irrigation (AYR is a common brand).  Use Flonase nasal spray each morning after using Afrin nasal spray and saline nasal irrigation. Try warm salt water gargles for sore throat.  Stop all antihistamines for now, and other non-prescription cough/cold preparations. Begin Azithromycin if not improving about one week or if persistent fever develops   Follow-up with family doctor if not improving about10 days.

## 2016-11-19 ENCOUNTER — Other Ambulatory Visit: Payer: Self-pay | Admitting: Physician Assistant

## 2016-11-24 ENCOUNTER — Encounter: Payer: Self-pay | Admitting: Physician Assistant

## 2016-11-24 ENCOUNTER — Ambulatory Visit (INDEPENDENT_AMBULATORY_CARE_PROVIDER_SITE_OTHER): Payer: BLUE CROSS/BLUE SHIELD | Admitting: Physician Assistant

## 2016-11-24 VITALS — BP 128/80 | HR 83 | Wt 173.0 lb

## 2016-11-24 DIAGNOSIS — Z13220 Encounter for screening for lipoid disorders: Secondary | ICD-10-CM | POA: Diagnosis not present

## 2016-11-24 DIAGNOSIS — Z79899 Other long term (current) drug therapy: Secondary | ICD-10-CM | POA: Diagnosis not present

## 2016-11-24 DIAGNOSIS — I10 Essential (primary) hypertension: Secondary | ICD-10-CM | POA: Diagnosis not present

## 2016-11-24 MED ORDER — LOSARTAN POTASSIUM 50 MG PO TABS: 50.0000 mg | ORAL_TABLET | Freq: Every day | ORAL | 2 refills | Status: DC

## 2016-11-24 NOTE — Progress Notes (Signed)
   Subjective:    Patient ID: Daniel Baldwin, male    DOB: 30-Jan-1951, 66 y.o.   MRN: [id number]  HPI Pt is a 66 yo male who presents to the clinic to follow up on HTN. He complains of dry cough for last 4 months on lisinopril. He denies any CP, palpitations, headaches, vision changes. He exercises regularly.   .. Active Ambulatory Problems    Diagnosis Date Noted  . Essential hypertension 01/21/2014  . Ulnar neuropathy of right upper extremity 01/21/2014  . Hyperlipidemia 01/21/2014  . Diarrhea 03/29/2016  . Blood in stool 03/29/2016   Resolved Ambulatory Problems    Diagnosis Date Noted  . No Resolved Ambulatory Problems   Past Medical History:  Diagnosis Date  . Hyperlipidemia 01/21/2014  . Hypertension       Review of Systems  All other systems reviewed and are negative.      Objective:   Physical Exam  Constitutional: He is oriented to person, place, and time. He appears well-developed and well-nourished.  HENT:  Head: Normocephalic and atraumatic.  Cardiovascular: Normal rate, regular rhythm and normal heart sounds.   Pulmonary/Chest: Effort normal and breath sounds normal.  Neurological: He is alert and oriented to person, place, and time.  Psychiatric: He has a normal mood and affect. His behavior is normal.          Assessment & Plan:  Marland KitchenMarland KitchenDravon was seen today for hypertension.  Diagnoses and all orders for this visit:  Essential hypertension -     losartan (COZAAR) 50 MG tablet; Take 1 tablet (50 mg total) by mouth daily. -     COMPLETE METABOLIC PANEL WITH GFR -     Lipid panel  Medication management -     COMPLETE METABOLIC PANEL WITH GFR  Screening for lipid disorders -     Lipid panel   Stop lisinopril. Start losartan. BP looks great today.  Discussed side effects of new medications. Follow up in 4 weeks for BP check and labs.

## 2016-12-08 ENCOUNTER — Encounter: Payer: Self-pay | Admitting: Physician Assistant

## 2016-12-16 ENCOUNTER — Other Ambulatory Visit: Payer: Self-pay | Admitting: Physician Assistant

## 2016-12-18 ENCOUNTER — Encounter: Payer: Self-pay | Admitting: Physician Assistant

## 2017-01-02 ENCOUNTER — Ambulatory Visit (INDEPENDENT_AMBULATORY_CARE_PROVIDER_SITE_OTHER): Payer: BLUE CROSS/BLUE SHIELD | Admitting: Physician Assistant

## 2017-01-02 ENCOUNTER — Other Ambulatory Visit: Payer: Self-pay | Admitting: Physician Assistant

## 2017-01-02 ENCOUNTER — Encounter: Payer: Self-pay | Admitting: Physician Assistant

## 2017-01-02 VITALS — BP 156/92 | HR 78 | Wt 175.0 lb

## 2017-01-02 DIAGNOSIS — E785 Hyperlipidemia, unspecified: Secondary | ICD-10-CM

## 2017-01-02 DIAGNOSIS — Z23 Encounter for immunization: Secondary | ICD-10-CM

## 2017-01-02 DIAGNOSIS — I10 Essential (primary) hypertension: Secondary | ICD-10-CM

## 2017-01-02 LAB — COMPLETE METABOLIC PANEL WITH GFR
AG RATIO: 1.8 (calc) (ref 1.0–2.5)
ALKALINE PHOSPHATASE (APISO): 60 U/L (ref 40–115)
ALT: 43 U/L (ref 9–46)
AST: 35 U/L (ref 10–35)
Albumin: 4.3 g/dL (ref 3.6–5.1)
BILIRUBIN TOTAL: 0.6 mg/dL (ref 0.2–1.2)
BUN: 13 mg/dL (ref 7–25)
CHLORIDE: 103 mmol/L (ref 98–110)
CO2: 29 mmol/L (ref 20–32)
Calcium: 9.2 mg/dL (ref 8.6–10.3)
Creat: 0.9 mg/dL (ref 0.70–1.25)
GFR, Est African American: 104 mL/min/{1.73_m2} (ref >=60)
GFR, Est Non African American: 90 mL/min/{1.73_m2} (ref >=60)
GLOBULIN: 2.4 g/dL (ref 1.9–3.7)
Glucose, Bld: 104 mg/dL — ABNORMAL HIGH (ref 65–99)
Potassium: 4.6 mmol/L (ref 3.5–5.3)
SODIUM: 139 mmol/L (ref 135–146)
Total Protein: 6.7 g/dL (ref 6.1–8.1)

## 2017-01-02 LAB — LIPID PANEL
CHOLESTEROL: 209 mg/dL — AB (ref <200)
HDL: 48 mg/dL (ref >40)
LDL Cholesterol (Calc): 130 mg/dL (calc) — ABNORMAL HIGH
Non-HDL Cholesterol (Calc): 161 mg/dL (calc) — ABNORMAL HIGH (ref <130)
Total CHOL/HDL Ratio: 4.4 (calc) (ref <5.0)
Triglycerides: 175 mg/dL — ABNORMAL HIGH (ref <150)

## 2017-01-02 NOTE — Progress Notes (Signed)
Pt is here for BP check. 156/92 Also said that you and him have email back and forth regarding the losartan, and he is taking 25 mg.  He will need a refill.  BP reading today is not at goal. What are his home readings? -Luvenia Starch

## 2017-01-03 ENCOUNTER — Encounter: Payer: Self-pay | Admitting: Physician Assistant

## 2017-01-03 DIAGNOSIS — R7301 Impaired fasting glucose: Secondary | ICD-10-CM | POA: Insufficient documentation

## 2017-01-03 MED ORDER — ATORVASTATIN CALCIUM 40 MG PO TABS: 40.0000 mg | ORAL_TABLET | Freq: Every day | ORAL | 1 refills | Status: DC

## 2017-01-03 NOTE — Progress Notes (Signed)
Patient has not checked home blood pressure readings in a few weeks. He will be going out of town this weekend and hopes to resolve the blood pressure medication issue before Friday. See email from today.

## 2017-01-03 NOTE — Addendum Note (Signed)
Addended byDonella Stade on: 01/03/2017 10:02 PM   Modules accepted: Orders

## 2017-01-04 ENCOUNTER — Other Ambulatory Visit: Payer: Self-pay | Admitting: Physician Assistant

## 2017-01-04 MED ORDER — LOSARTAN POTASSIUM 25 MG PO TABS: 25.0000 mg | ORAL_TABLET | Freq: Every day | ORAL | 2 refills | Status: DC

## 2017-01-04 MED ORDER — HYDROCHLOROTHIAZIDE 12.5 MG PO TABS: 12.5000 mg | ORAL_TABLET | Freq: Every day | ORAL | 2 refills | Status: DC

## 2017-03-27 ENCOUNTER — Other Ambulatory Visit: Payer: Self-pay | Admitting: *Deleted

## 2017-03-27 ENCOUNTER — Telehealth: Payer: Self-pay | Admitting: Physician Assistant

## 2017-03-27 ENCOUNTER — Ambulatory Visit: Payer: BLUE CROSS/BLUE SHIELD

## 2017-03-27 ENCOUNTER — Other Ambulatory Visit: Payer: Self-pay | Admitting: Physician Assistant

## 2017-03-27 MED ORDER — LOSARTAN POTASSIUM 25 MG PO TABS: 25.0000 mg | ORAL_TABLET | Freq: Every day | ORAL | 0 refills | Status: DC

## 2017-03-27 MED ORDER — HYDROCHLOROTHIAZIDE 12.5 MG PO TABS: 12.5000 mg | ORAL_TABLET | Freq: Every day | ORAL | 0 refills | Status: DC

## 2017-03-27 NOTE — Telephone Encounter (Signed)
Patient was scheduled on the nurse's schedule today for a BP check. We called to let patient know that he did not need a nurse appointment, but that he needed an appointment with [facility] in order to get refills on his BP meds. I rescheduled him for 04/13/17. However, he will be running out of his BP meds (Losartan & Hydrochlorothiazide) on 04/04/17. Patient is requesting that a supply of meds be called in to get him through to his appointment. Please advise. Thank you!

## 2017-03-27 NOTE — Telephone Encounter (Signed)
I just left vm on pt's phone notifying him that I was sending him in a 1 month supply of both medications.

## 2017-04-13 ENCOUNTER — Ambulatory Visit (INDEPENDENT_AMBULATORY_CARE_PROVIDER_SITE_OTHER): Payer: BLUE CROSS/BLUE SHIELD | Admitting: Physician Assistant

## 2017-04-13 ENCOUNTER — Encounter: Payer: Self-pay | Admitting: Physician Assistant

## 2017-04-13 VITALS — BP 138/86 | HR 85 | Wt 179.3 lb

## 2017-04-13 DIAGNOSIS — K580 Irritable bowel syndrome with diarrhea: Secondary | ICD-10-CM | POA: Diagnosis not present

## 2017-04-13 DIAGNOSIS — R7301 Impaired fasting glucose: Secondary | ICD-10-CM | POA: Diagnosis not present

## 2017-04-13 DIAGNOSIS — E782 Mixed hyperlipidemia: Secondary | ICD-10-CM | POA: Diagnosis not present

## 2017-04-13 DIAGNOSIS — I10 Essential (primary) hypertension: Secondary | ICD-10-CM

## 2017-04-13 LAB — POCT GLYCOSYLATED HEMOGLOBIN (HGB A1C): HEMOGLOBIN A1C: 5.5

## 2017-04-13 MED ORDER — HYDROCHLOROTHIAZIDE 12.5 MG PO TABS: 12.5000 mg | ORAL_TABLET | Freq: Every day | ORAL | 1 refills | Status: DC

## 2017-04-13 MED ORDER — HYDROCHLOROTHIAZIDE 12.5 MG PO TABS: 12.5000 mg | ORAL_TABLET | Freq: Every day | ORAL | 2 refills | Status: DC

## 2017-04-13 MED ORDER — LOSARTAN POTASSIUM 25 MG PO TABS: 25.0000 mg | ORAL_TABLET | Freq: Every day | ORAL | 2 refills | Status: DC

## 2017-04-13 MED ORDER — LOSARTAN POTASSIUM 25 MG PO TABS: 25.0000 mg | ORAL_TABLET | Freq: Every day | ORAL | 1 refills | Status: DC

## 2017-04-13 NOTE — Patient Instructions (Signed)
Irritable Bowel Syndrome, Adult Irritable bowel syndrome (IBS) is not one specific disease. It is a group of symptoms that affects the organs responsible for digestion (gastrointestinal or GI tract). To regulate how your GI tract works, your body sends signals back and forth between your intestines and your brain. If you have IBS, there may be a problem with these signals. As a result, your GI tract does not function normally. Your intestines may become more sensitive and overreact to certain things. This is especially true when you eat certain foods or when you are under stress. There are four types of IBS. These may be determined based on the consistency of your stool:  IBS with diarrhea.  IBS with constipation.  Mixed IBS.  Unsubtyped IBS.  It is important to know which type of IBS you have. Some treatments are more likely to be helpful for certain types of IBS. What are the causes? The exact cause of IBS is not known. What increases the risk? You may have a higher risk of IBS if:  You are a woman.  You are younger than 65 years old.  You have a family history of IBS.  You have mental health problems.  You have had bacterial infection of your GI tract.  What are the signs or symptoms? Symptoms of IBS vary from person to person. The main symptom is abdominal pain or discomfort. Additional symptoms usually include one or more of the following:  Diarrhea, constipation, or both.  Abdominal swelling or bloating.  Feeling full or sick after eating a small or regular-size meal.  Frequent gas.  Mucus in the stool.  A feeling of having more stool left after a bowel movement.  Symptoms tend to come and go. They may be associated with stress, psychiatric conditions, or nothing at all. How is this diagnosed? There is no specific test to diagnose IBS. Your health care provider will make a diagnosis based on a physical exam, medical history, and your symptoms. You may have other  tests to rule out other conditions that may be causing your symptoms. These may include:  Blood tests.  X-rays.  CT scan.  Endoscopy and colonoscopy. This is a test in which your GI tract is viewed with a long, thin, flexible tube.  How is this treated? There is no cure for IBS, but treatment can help relieve symptoms. IBS treatment often includes:  Changes to your diet, such as: ? Eating more fiber. ? Avoiding foods that cause symptoms. ? Drinking more water. ? Eating regular, medium-sized portioned meals.  Medicines. These may include: ? Fiber supplements if you have constipation. ? Medicine to control diarrhea (antidiarrheal medicines). ? Medicine to help control muscle spasms in your GI tract (antispasmodic medicines). ? Medicines to help with any mental health issues, such as antidepressants or tranquilizers.  Therapy. ? Talk therapy may help with anxiety, depression, or other mental health issues that can make IBS symptoms worse.  Stress reduction. ? Managing your stress can help keep symptoms under control.  Follow these instructions at home:  Take medicines only as directed by your health care provider.  Eat a healthy diet. ? Avoid foods and drinks with added sugar. ? Include more whole grains, fruits, and vegetables gradually into your diet. This may be especially helpful if you have IBS with constipation. ? Avoid any foods and drinks that make your symptoms worse. These may include dairy products and caffeinated or carbonated drinks. ? Do not eat large meals. ? Drink enough   fluid to keep your urine clear or pale yellow.  Exercise regularly. Ask your health care provider for recommendations of good activities for you.  Keep all follow-up visits as directed by your health care provider. This is important. Contact a health care provider if:  You have constant pain.  You have trouble or pain with swallowing.  You have worsening diarrhea. Get help right away  if:  You have severe and worsening abdominal pain.  You have diarrhea and: ? You have a rash, stiff neck, or severe headache. ? You are irritable, sleepy, or difficult to awaken. ? You are weak, dizzy, or extremely thirsty.  You have bright red blood in your stool or you have black tarry stools.  You have unusual abdominal swelling that is painful.  You vomit continuously.  You vomit blood (hematemesis).  You have both abdominal pain and a fever. This information is not intended to replace advice given to you by your health care provider. Make sure you discuss any questions you have with your health care provider. Document Released: 03/27/2005 Document Revised: 08/27/2015 Document Reviewed: 12/12/2013 Elsevier Interactive Patient Education  2018 [employer]  

## 2017-04-14 NOTE — Progress Notes (Signed)
   Subjective:    Patient ID: Daniel Baldwin, male    DOB: 1950-09-07, 67 y.o.   MRN: [id number]  HPI Pt is a  Pleasant 67 yo male who presents to the clinic to follow up on elevated blood pressure. Medication was adjusted at last visit and now needs refilled. He was also recently started on lipitor. No side effects or concerns. No CP, palpitations, headaches, vision changes.   Last labs elevated fasting glucose. Needs a1c.   He does c/o intermittent diarrhea and abdominal pain/gassy. Resolved with bowel movement. Triggered by foods.   .. Active Ambulatory Problems    Diagnosis Date Noted  . Essential hypertension 01/21/2014  . Ulnar neuropathy of right upper extremity 01/21/2014  . Hyperlipidemia 01/21/2014  . Diarrhea 03/29/2016  . Blood in stool 03/29/2016  . Elevated fasting glucose 01/03/2017   Resolved Ambulatory Problems    Diagnosis Date Noted  . No Resolved Ambulatory Problems   Past Medical History:  Diagnosis Date  . Hyperlipidemia 01/21/2014  . Hypertension       Review of Systems  All other systems reviewed and are negative.      Objective:   Physical Exam  Constitutional: He is oriented to person, place, and time. He appears well-developed and well-nourished.  HENT:  Head: Normocephalic and atraumatic.  Cardiovascular: Normal rate, regular rhythm and normal heart sounds.  Pulmonary/Chest: Effort normal and breath sounds normal.  Neurological: He is alert and oriented to person, place, and time.  Psychiatric: He has a normal mood and affect. His behavior is normal.          Assessment & Plan:  Marland KitchenMarland KitchenWarren was seen today for hypertension.  Diagnoses and all orders for this visit:  Essential hypertension -     losartan (COZAAR) 25 MG tablet; Take 1 tablet (25 mg total) by mouth daily. -     hydrochlorothiazide (HYDRODIURIL) 12.5 MG tablet; Take 1 tablet (12.5 mg total) by mouth daily.  Elevated fasting glucose -     POCT glycosylated hemoglobin (Hb  A1C)  Mixed hyperlipidemia -     Lipid Panel w/reflex Direct LDL  Irritable bowel syndrome with diarrhea  Other orders -     Discontinue: hydrochlorothiazide (HYDRODIURIL) 12.5 MG tablet; Take 1 tablet (12.5 mg total) by mouth daily. -     Discontinue: losartan (COZAAR) 25 MG tablet; Take 1 tablet (25 mg total) by mouth daily.   .. Lab Results  Component Value Date   HGBA1C 5.5 04/13/2017   BP controlled refilled medications for 1 year.   Recently started lipitor. Printed order to have drawn in next few months.   a1c is great.   Discussed IBs. Encouraged to find food triggers. Consider probiotic. HO given. Follow up as needed.   Follow up in CPE in August.

## 2017-06-21 LAB — LIPID PANEL W/REFLEX DIRECT LDL
CHOLESTEROL: 154 mg/dL (ref <200)
HDL: 45 mg/dL (ref >40)
LDL Cholesterol (Calc): 82 mg/dL (calc)
Non-HDL Cholesterol (Calc): 109 mg/dL (calc) (ref <130)
Total CHOL/HDL Ratio: 3.4 (calc) (ref <5.0)
Triglycerides: 161 mg/dL — ABNORMAL HIGH (ref <150)

## 2017-06-21 NOTE — Progress Notes (Signed)
Call pt: LDL so much better. Looks really good. TG still not quite to goal. Watch sugars and carbs. lipitor working well. Refill for next year.

## 2017-06-22 ENCOUNTER — Other Ambulatory Visit: Payer: Self-pay | Admitting: *Deleted

## 2017-06-22 ENCOUNTER — Other Ambulatory Visit: Payer: Self-pay | Admitting: Physician Assistant

## 2017-06-22 MED ORDER — ATORVASTATIN CALCIUM 40 MG PO TABS: 40.0000 mg | ORAL_TABLET | Freq: Every day | ORAL | 4 refills | Status: DC

## 2017-10-08 ENCOUNTER — Encounter: Payer: Self-pay | Admitting: Physician Assistant

## 2017-10-08 ENCOUNTER — Ambulatory Visit (INDEPENDENT_AMBULATORY_CARE_PROVIDER_SITE_OTHER): Payer: Medicare HMO | Admitting: Physician Assistant

## 2017-10-08 VITALS — BP 136/83 | HR 71 | Ht 70.0 in | Wt 173.0 lb

## 2017-10-08 DIAGNOSIS — Z136 Encounter for screening for cardiovascular disorders: Secondary | ICD-10-CM

## 2017-10-08 DIAGNOSIS — R7989 Other specified abnormal findings of blood chemistry: Secondary | ICD-10-CM | POA: Diagnosis not present

## 2017-10-08 DIAGNOSIS — I10 Essential (primary) hypertension: Secondary | ICD-10-CM | POA: Diagnosis not present

## 2017-10-08 DIAGNOSIS — Z131 Encounter for screening for diabetes mellitus: Secondary | ICD-10-CM | POA: Diagnosis not present

## 2017-10-08 DIAGNOSIS — Z87891 Personal history of nicotine dependence: Secondary | ICD-10-CM | POA: Diagnosis not present

## 2017-10-08 DIAGNOSIS — Z125 Encounter for screening for malignant neoplasm of prostate: Secondary | ICD-10-CM | POA: Diagnosis not present

## 2017-10-08 DIAGNOSIS — E785 Hyperlipidemia, unspecified: Secondary | ICD-10-CM | POA: Diagnosis not present

## 2017-10-08 DIAGNOSIS — Z1159 Encounter for screening for other viral diseases: Secondary | ICD-10-CM | POA: Diagnosis not present

## 2017-10-08 DIAGNOSIS — R7309 Other abnormal glucose: Secondary | ICD-10-CM | POA: Diagnosis not present

## 2017-10-08 DIAGNOSIS — Z23 Encounter for immunization: Secondary | ICD-10-CM | POA: Diagnosis not present

## 2017-10-08 MED ORDER — HYDROCHLOROTHIAZIDE 12.5 MG PO TABS: 12.5000 mg | ORAL_TABLET | Freq: Every day | ORAL | 3 refills | Status: DC

## 2017-10-08 MED ORDER — LOSARTAN POTASSIUM 25 MG PO TABS: 25.0000 mg | ORAL_TABLET | Freq: Every day | ORAL | 3 refills | Status: DC

## 2017-10-08 NOTE — Patient Instructions (Addendum)
4000mg  a day fish oil for triglycerides.  Food Choices to Lower Your Triglycerides Triglycerides are a type of fat in your blood. High levels of triglycerides can increase the risk of heart disease and stroke. If your triglyceride levels are high, the foods you eat and your eating habits are very important. Choosing the right foods can help lower your triglycerides. What general guidelines do I need to follow?  Lose weight if you are overweight.  Limit or avoid alcohol.  Fill one half of your plate with vegetables and green salads.  Limit fruit to two servings a day. Choose fruit instead of juice.  Make one fourth of your plate whole grains. Look for the word "whole" as the first word in the ingredient list.  Fill one fourth of your plate with lean protein foods.  Enjoy fatty fish (such as salmon, mackerel, sardines, and tuna) three times a week.  Choose healthy fats.  Limit foods high in starch and sugar.  Eat more home-cooked food and less restaurant, buffet, and fast food.  Limit fried foods.  Cook foods using methods other than frying.  Limit saturated fats.  Check ingredient lists to avoid foods with partially hydrogenated oils (trans fats) in them. What foods can I eat? Grains Whole grains, such as whole wheat or whole grain breads, crackers, cereals, and pasta. Unsweetened oatmeal, bulgur, barley, quinoa, or brown rice. Corn or whole wheat flour tortillas. Vegetables Fresh or frozen vegetables (raw, steamed, roasted, or grilled). Green salads. Fruits All fresh, canned (in natural juice), or frozen fruits. Meat and Other Protein Products Ground beef (85% or leaner), grass-fed beef, or beef trimmed of fat. Skinless chicken or [place]. Ground chicken or [place]. Pork trimmed of fat. All fish and seafood. Eggs. Dried beans, peas, or lentils. Unsalted nuts or seeds. Unsalted canned or dry beans. Dairy Low-fat dairy products, such as skim or 1% milk, 2% or reduced-fat  cheeses, low-fat ricotta or cottage cheese, or plain low-fat yogurt. Fats and Oils Tub margarines without trans fats. Light or reduced-fat mayonnaise and salad dressings. Avocado. Safflower, olive, or canola oils. Natural peanut or almond butter. The items listed above may not be a complete list of recommended foods or beverages. Contact your dietitian for more options. What foods are not recommended? Grains White bread. White pasta. White rice. Cornbread. Bagels, pastries, and croissants. Crackers that contain trans fat. Vegetables White potatoes. Corn. Creamed or fried vegetables. Vegetables in a cheese sauce. Fruits Dried fruits. Canned fruit in light or heavy syrup. Fruit juice. Meat and Other Protein Products Fatty cuts of meat. Ribs, chicken wings, bacon, sausage, bologna, salami, chitterlings, fatback, hot dogs, bratwurst, and packaged luncheon meats. Dairy Whole or 2% milk, cream, half-and-half, and cream cheese. Whole-fat or sweetened yogurt. Full-fat cheeses. Nondairy creamers and whipped toppings. Processed cheese, cheese spreads, or cheese curds. Sweets and Desserts Corn syrup, sugars, honey, and molasses. Candy. Jam and jelly. Syrup. Sweetened cereals. Cookies, pies, cakes, donuts, muffins, and ice cream. Fats and Oils Butter, stick margarine, lard, shortening, ghee, or bacon fat. Coconut, palm kernel, or palm oils. Beverages Alcohol. Sweetened drinks (such as sodas, lemonade, and fruit drinks or punches). The items listed above may not be a complete list of foods and beverages to avoid. Contact your dietitian for more information. This information is not intended to replace advice given to you by your health care provider. Make sure you discuss any questions you have with your health care provider. Document Released: 01/13/2004 Document Revised: 09/02/2015 Document Reviewed: 01/29/2013  Chartered certified accountant Patient Education  [employer].

## 2017-10-08 NOTE — Progress Notes (Signed)
Subjective:    Patient ID: Daniel Baldwin, male    DOB: November 19, 1950, 67 y.o.   MRN: [id number]  HPI  Pt is a 67 yo male who presents to the clinic for routine check up and medication refill.   .. Active Ambulatory Problems    Diagnosis Date Noted  . Essential hypertension 01/21/2014  . Ulnar neuropathy of right upper extremity 01/21/2014  . Hyperlipidemia 01/21/2014  . Diarrhea 03/29/2016  . Blood in stool 03/29/2016  . Elevated fasting glucose 01/03/2017   Resolved Ambulatory Problems    Diagnosis Date Noted  . No Resolved Ambulatory Problems   Past Medical History:  Diagnosis Date  . Hyperlipidemia 01/21/2014  . Hypertension    He does have some questions today:  He would like to know if he should be on ASA. He wants to know if he needs any vaccines.  He wonders if he should get his blood type tested.  He questions any screenings to make sure he is healthy. Such as prostate.  He did smoke for about 25 years.  HTN- no CP, palpitations, headaches. He was switched to losartan due to cough with lisinopril. He continues to have a dry cough. He wonders if it could be losartan.    .. Family History  Problem Relation Age of Onset  . Hypertension Mother   . Hypertension Father    .[name] Social History   Socioeconomic History  . Marital status: Married    Spouse name: Not on file  . Number of children: Not on file  . Years of education: Not on file  . Highest education level: Not on file  Occupational History  . Not on file  Social Needs  . Financial resource strain: Not on file  . Food insecurity:    Worry: Not on file    Inability: Not on file  . Transportation needs:    Medical: Not on file    Non-medical: Not on file  Tobacco Use  . Smoking status: Former Smoker    Last attempt to quit: 08/21/2001    Years since quitting: 16.1  . Smokeless tobacco: Never Used  Substance and Sexual Activity  . Alcohol use: Yes  . Drug use: No  . Sexual activity: Not on file   Lifestyle  . Physical activity:    Days per week: Not on file    Minutes per session: Not on file  . Stress: Not on file  Relationships  . Social connections:    Talks on phone: Not on file    Gets together: Not on file    Attends religious service: Not on file    Active member of club or organization: Not on file    Attends meetings of clubs or organizations: Not on file    Relationship status: Not on file  . Intimate partner violence:    Fear of current or ex partner: Not on file    Emotionally abused: Not on file    Physically abused: Not on file    Forced sexual activity: Not on file  Other Topics Concern  . Not on file  Social History Narrative  . Not on file    Review of Systems  All other systems reviewed and are negative.      Objective:   Physical Exam  Constitutional: He is oriented to person, place, and time. He appears well-developed and well-nourished.  HENT:  Head: Normocephalic and atraumatic.  Eyes: Conjunctivae and EOM are normal.  Neck: Normal range  of motion. Neck supple.  Cardiovascular: Normal rate and regular rhythm.  Pulmonary/Chest: Effort normal and breath sounds normal.  Abdominal: Soft. Bowel sounds are normal. He exhibits no distension. There is no tenderness.  Lymphadenopathy:    He has no cervical adenopathy.  Neurological: He is alert and oriented to person, place, and time.  Psychiatric: He has a normal mood and affect. His behavior is normal.          Assessment & Plan:  Marland KitchenMarland KitchenEulises was seen today for follow-up.  Diagnoses and all orders for this visit:  Essential hypertension -     hydrochlorothiazide (HYDRODIURIL) 12.5 MG tablet; Take 1 tablet (12.5 mg total) by mouth daily. -     losartan (COZAAR) 25 MG tablet; Take 1 tablet (25 mg total) by mouth daily. -     VAS [place] AAA DUPLEX; Future  Need for hepatitis C screening test -     Hepatitis C Antibody  Prostate cancer screening -     PSA -     CBC with  Differential/Platelet  Screening for diabetes mellitus -     Hemoglobin A1c  Screening for AAA (abdominal aortic aneurysm) -     VAS [place] AAA DUPLEX; Future  Former smoker -     VAS [place]; Future  Need for pneumococcal vaccination -     Pneumococcal conjugate vaccine 13-valent IM   .[name] Depression screen [facility] 2/9 10/08/2017 11/24/2016  Decreased Interest 0 0  Down, Depressed, Hopeless 0 0  PHQ - 2 Score 0 0  Altered sleeping 0 -  Tired, decreased energy 0 -  Change in appetite 0 -  Feeling bad or failure about yourself  0 -  Trouble concentrating 0 -  Moving slowly or fidgety/restless 0 -  Suicidal thoughts 0 -  PHQ-9 Score 0 -   Lipid was just done.  Added other screening labs.  Pt request PSA screening.pt aware of pros and cons.  Pt given pneumonia vaccine. Pt is up to date on all other vaccines. He has had zoster but not shingrix. Pt to be placed on waiting list.  Discussed no longer recommendation without CAD or CV event to be on ASA.pt may stop.  Pt hx of smoking and older than 58. wil get AAA screening.   BP controlled. Offered to try off losaartan but declined today. Try flonase first and see if some PND. Denies GERD symptoms.   ..IPSS Questionnaire (AUA-7): Over the past month.   1)  How often have you had a sensation of not emptying your bladder completely after you finish urinating?  0 - Not at all  2)  How often have you had to urinate again less than two hours after you finished urinating? 0 - Not at all  3)  How often have you found you stopped and started again several times when you urinated?  0 - Not at all  4) How difficult have you found it to postpone urination?  0 - Not at all  5) How often have you had a weak urinary stream?  0 - Not at all  6) How often have you had to push or strain to begin urination?  0 - Not at all  7) How many times did you most typically get up to urinate from the time you went to bed until the time you got up in the morning?   1 - 1 time  Total score:  0-7 mildly symptomatic   8-19 moderately symptomatic  20-35 severely symptomatic

## 2017-10-09 LAB — CBC WITH DIFFERENTIAL/PLATELET
BASOS PCT: 1.4 %
Basophils Absolute: 81 cells/uL (ref 0–200)
EOS ABS: 197 {cells}/uL (ref 15–500)
EOS PCT: 3.4 %
HCT: 46.7 % (ref 38.5–50.0)
HEMOGLOBIN: 15.8 g/dL (ref 13.2–17.1)
Lymphs Abs: 1230 cells/uL (ref 850–3900)
MCH: 30.6 pg (ref 27.0–33.0)
MCHC: 33.8 g/dL (ref 32.0–36.0)
MCV: 90.5 fL (ref 80.0–100.0)
MONOS PCT: 13.8 %
MPV: 10.7 fL (ref 7.5–12.5)
NEUTROS ABS: 3492 {cells}/uL (ref 1500–7800)
Neutrophils Relative %: 60.2 %
PLATELETS: 261 10*3/uL (ref 140–400)
RBC: 5.16 10*6/uL (ref 4.20–5.80)
RDW: 11.8 % (ref 11.0–15.0)
TOTAL LYMPHOCYTE: 21.2 %
WBC mixed population: 800 cells/uL (ref 200–950)
WBC: 5.8 10*3/uL (ref 3.8–10.8)

## 2017-10-09 LAB — PSA: PSA: 1 ng/mL (ref <=4.0)

## 2017-10-09 LAB — HEMOGLOBIN A1C
Hgb A1c MFr Bld: 5.4 % of total Hgb (ref <5.7)
Mean Plasma Glucose: 108 (calc)
eAG (mmol/L): 6 (calc)

## 2017-10-09 LAB — HEPATITIS C ANTIBODY
HEP C AB: NONREACTIVE
SIGNAL TO CUT-OFF: 0.03 (ref <1.00)

## 2017-10-09 NOTE — Progress Notes (Signed)
Call pt: PSA looks amazing. Sugar is stable and improving some. Labs look great.

## 2017-10-10 ENCOUNTER — Encounter: Payer: Self-pay | Admitting: Physician Assistant

## 2017-10-10 DIAGNOSIS — Z87891 Personal history of nicotine dependence: Secondary | ICD-10-CM | POA: Insufficient documentation

## 2017-10-18 DIAGNOSIS — R69 Illness, unspecified: Secondary | ICD-10-CM | POA: Diagnosis not present

## 2017-12-31 DIAGNOSIS — H5210 Myopia, unspecified eye: Secondary | ICD-10-CM | POA: Diagnosis not present

## 2017-12-31 DIAGNOSIS — E7800 Pure hypercholesterolemia, unspecified: Secondary | ICD-10-CM | POA: Diagnosis not present

## 2017-12-31 DIAGNOSIS — I10 Essential (primary) hypertension: Secondary | ICD-10-CM | POA: Diagnosis not present

## 2017-12-31 DIAGNOSIS — Z0100 Encounter for examination of eyes and vision without abnormal findings: Secondary | ICD-10-CM | POA: Diagnosis not present

## 2018-01-11 ENCOUNTER — Encounter: Payer: Self-pay | Admitting: Physician Assistant

## 2018-01-11 NOTE — Telephone Encounter (Signed)
Ok to cancel

## 2018-01-22 ENCOUNTER — Ambulatory Visit (INDEPENDENT_AMBULATORY_CARE_PROVIDER_SITE_OTHER): Payer: Medicare HMO | Admitting: Family Medicine

## 2018-01-22 DIAGNOSIS — Z23 Encounter for immunization: Secondary | ICD-10-CM | POA: Diagnosis not present

## 2018-01-25 ENCOUNTER — Other Ambulatory Visit (HOSPITAL_BASED_OUTPATIENT_CLINIC_OR_DEPARTMENT_OTHER): Payer: Medicare HMO

## 2018-04-25 DIAGNOSIS — R69 Illness, unspecified: Secondary | ICD-10-CM | POA: Diagnosis not present

## 2018-05-08 DIAGNOSIS — K625 Hemorrhage of anus and rectum: Secondary | ICD-10-CM | POA: Diagnosis not present

## 2018-05-08 DIAGNOSIS — R1084 Generalized abdominal pain: Secondary | ICD-10-CM | POA: Diagnosis not present

## 2018-05-08 DIAGNOSIS — R195 Other fecal abnormalities: Secondary | ICD-10-CM | POA: Diagnosis not present

## 2018-05-14 DIAGNOSIS — K5190 Ulcerative colitis, unspecified, without complications: Secondary | ICD-10-CM | POA: Diagnosis not present

## 2018-05-14 DIAGNOSIS — K51911 Ulcerative colitis, unspecified with rectal bleeding: Secondary | ICD-10-CM | POA: Diagnosis not present

## 2018-05-14 DIAGNOSIS — K921 Melena: Secondary | ICD-10-CM | POA: Diagnosis not present

## 2018-05-22 DIAGNOSIS — R69 Illness, unspecified: Secondary | ICD-10-CM | POA: Diagnosis not present

## 2018-05-30 DIAGNOSIS — R69 Illness, unspecified: Secondary | ICD-10-CM | POA: Diagnosis not present

## 2018-06-14 DIAGNOSIS — R1030 Lower abdominal pain, unspecified: Secondary | ICD-10-CM | POA: Diagnosis not present

## 2018-06-14 DIAGNOSIS — K51311 Ulcerative (chronic) rectosigmoiditis with rectal bleeding: Secondary | ICD-10-CM | POA: Diagnosis not present

## 2018-06-14 DIAGNOSIS — R195 Other fecal abnormalities: Secondary | ICD-10-CM | POA: Diagnosis not present

## 2018-07-03 ENCOUNTER — Other Ambulatory Visit: Payer: Self-pay | Admitting: Physician Assistant

## 2018-07-09 DIAGNOSIS — K51311 Ulcerative (chronic) rectosigmoiditis with rectal bleeding: Secondary | ICD-10-CM | POA: Diagnosis not present

## 2018-07-09 DIAGNOSIS — R195 Other fecal abnormalities: Secondary | ICD-10-CM | POA: Diagnosis not present

## 2018-07-09 DIAGNOSIS — R1030 Lower abdominal pain, unspecified: Secondary | ICD-10-CM | POA: Diagnosis not present

## 2018-07-22 DIAGNOSIS — K51919 Ulcerative colitis, unspecified with unspecified complications: Secondary | ICD-10-CM | POA: Diagnosis not present

## 2018-07-22 DIAGNOSIS — K625 Hemorrhage of anus and rectum: Secondary | ICD-10-CM | POA: Diagnosis not present

## 2018-07-24 DIAGNOSIS — R197 Diarrhea, unspecified: Secondary | ICD-10-CM | POA: Diagnosis not present

## 2018-07-25 DIAGNOSIS — K51211 Ulcerative (chronic) proctitis with rectal bleeding: Secondary | ICD-10-CM | POA: Insufficient documentation

## 2018-07-25 DIAGNOSIS — K645 Perianal venous thrombosis: Secondary | ICD-10-CM | POA: Insufficient documentation

## 2018-08-28 DIAGNOSIS — R152 Fecal urgency: Secondary | ICD-10-CM | POA: Diagnosis not present

## 2018-08-28 DIAGNOSIS — K51311 Ulcerative (chronic) rectosigmoiditis with rectal bleeding: Secondary | ICD-10-CM | POA: Diagnosis not present

## 2018-08-28 DIAGNOSIS — R198 Other specified symptoms and signs involving the digestive system and abdomen: Secondary | ICD-10-CM | POA: Diagnosis not present

## 2018-08-28 DIAGNOSIS — R1030 Lower abdominal pain, unspecified: Secondary | ICD-10-CM | POA: Diagnosis not present

## 2018-09-05 DIAGNOSIS — K645 Perianal venous thrombosis: Secondary | ICD-10-CM | POA: Diagnosis not present

## 2018-09-23 ENCOUNTER — Other Ambulatory Visit: Payer: Self-pay | Admitting: Physician Assistant

## 2018-09-24 DIAGNOSIS — R198 Other specified symptoms and signs involving the digestive system and abdomen: Secondary | ICD-10-CM | POA: Diagnosis not present

## 2018-09-24 DIAGNOSIS — K51311 Ulcerative (chronic) rectosigmoiditis with rectal bleeding: Secondary | ICD-10-CM | POA: Diagnosis not present

## 2018-09-24 DIAGNOSIS — R152 Fecal urgency: Secondary | ICD-10-CM | POA: Diagnosis not present

## 2018-09-24 DIAGNOSIS — R195 Other fecal abnormalities: Secondary | ICD-10-CM | POA: Diagnosis not present

## 2018-10-02 ENCOUNTER — Encounter: Payer: Self-pay | Admitting: Physician Assistant

## 2018-10-02 ENCOUNTER — Ambulatory Visit: Payer: Medicare HMO | Admitting: Physician Assistant

## 2018-10-02 VITALS — BP 138/86 | HR 70 | Temp 98.6°F | Ht 70.0 in | Wt 167.0 lb

## 2018-10-02 DIAGNOSIS — Z0000 Encounter for general adult medical examination without abnormal findings: Secondary | ICD-10-CM

## 2018-10-02 DIAGNOSIS — Z125 Encounter for screening for malignant neoplasm of prostate: Secondary | ICD-10-CM

## 2018-10-02 DIAGNOSIS — E782 Mixed hyperlipidemia: Secondary | ICD-10-CM

## 2018-10-02 DIAGNOSIS — Z23 Encounter for immunization: Secondary | ICD-10-CM

## 2018-10-02 DIAGNOSIS — I10 Essential (primary) hypertension: Secondary | ICD-10-CM

## 2018-10-02 DIAGNOSIS — R7301 Impaired fasting glucose: Secondary | ICD-10-CM

## 2018-10-02 DIAGNOSIS — K51311 Ulcerative (chronic) rectosigmoiditis with rectal bleeding: Secondary | ICD-10-CM

## 2018-10-02 MED ORDER — AMBULATORY NON FORMULARY MEDICATION: 0 refills | Status: DC

## 2018-10-02 MED ORDER — LOSARTAN POTASSIUM 25 MG PO TABS: 25.0000 mg | ORAL_TABLET | Freq: Every day | ORAL | 3 refills | Status: DC

## 2018-10-02 MED ORDER — ATORVASTATIN CALCIUM 40 MG PO TABS: 40.0000 mg | ORAL_TABLET | Freq: Every day | ORAL | 3 refills | Status: DC

## 2018-10-02 MED ORDER — HYDROCHLOROTHIAZIDE 12.5 MG PO TABS: 12.5000 mg | ORAL_TABLET | Freq: Every day | ORAL | 3 refills | Status: DC

## 2018-10-02 NOTE — Patient Instructions (Signed)

## 2018-10-02 NOTE — Progress Notes (Signed)
Subjective:    Patient ID: Daniel Baldwin, male    DOB: 10/25/1950, 68 y.o.   MRN: [id number]  HPI Pt is a 68 yo male with Ulcerative Colitis, HTN, HLD who presents to the clinic for yearly annual exam.   GI is managing his UC flare. He is on the end of the flare and feeling better.    .. Active Ambulatory Problems    Diagnosis Date Noted  . Essential hypertension 01/21/2014  . Ulnar neuropathy of right upper extremity 01/21/2014  . Hyperlipidemia 01/21/2014  . Diarrhea 03/29/2016  . Blood in stool 03/29/2016  . Elevated fasting glucose 01/03/2017  . Former smoker 10/10/2017  . Ulcerative proctitis with rectal bleeding ([place]) 07/25/2018  . Thrombosed external hemorrhoid 07/25/2018   Resolved Ambulatory Problems    Diagnosis Date Noted  . No Resolved Ambulatory Problems   Past Medical History:  Diagnosis Date  . Hypertension   . UC (ulcerative colitis) ([place])    .[name] Family History  Problem Relation Age of Onset  . Hypertension Mother   . Hypertension Father    .[name] Social History   Socioeconomic History  . Marital status: Married    Spouse name: Not on file  . Number of children: Not on file  . Years of education: Not on file  . Highest education level: Not on file  Occupational History  . Not on file  Social Needs  . Financial resource strain: Not on file  . Food insecurity    Worry: Not on file    Inability: Not on file  . Transportation needs    Medical: Not on file    Non-medical: Not on file  Tobacco Use  . Smoking status: Former Smoker    Quit date: 08/21/2001    Years since quitting: 17.1  . Smokeless tobacco: Never Used  Substance and Sexual Activity  . Alcohol use: Yes  . Drug use: No  . Sexual activity: Not on file  Lifestyle  . Physical activity    Days per week: Not on file    Minutes per session: Not on file  . Stress: Not on file  Relationships  . Social Herbalist on phone: Not on file    Gets together: Not on file    Attends  religious service: Not on file    Active member of club or organization: Not on file    Attends meetings of clubs or organizations: Not on file    Relationship status: Not on file  . Intimate partner violence    Fear of current or ex partner: Not on file    Emotionally abused: Not on file    Physically abused: Not on file    Forced sexual activity: Not on file  Other Topics Concern  . Not on file  Social History Narrative  . Not on file      Review of Systems  All other systems reviewed and are negative.      Objective:   Physical Exam BP 138/86   Pulse 70   Temp 98.6 F (37 C) (Oral)   Ht 5\' 10"  (1.778 m)   Wt 167 lb (75.8 kg)   SpO2 96%   BMI 23.96 kg/m   General Appearance:    Alert, cooperative, no distress, appears stated age  Head:    Normocephalic, without obvious abnormality, atraumatic  Eyes:    PERRL, conjunctiva/corneas clear, EOM's intact, fundi    benign, both eyes  Ears:    Normal TM's and external ear canals, both ears  Nose:   Nares normal, septum midline, mucosa normal, no drainage    or sinus tenderness  Throat:   Lips, mucosa, and tongue normal; teeth and gums normal  Neck:   Supple, symmetrical, trachea midline, no adenopathy;       thyroid:  No enlargement/tenderness/nodules; no carotid   bruit or JVD  Back:     Symmetric, no curvature, ROM normal, no CVA tenderness  Lungs:     Clear to auscultation bilaterally, respirations unlabored  Chest wall:    No tenderness or deformity  Heart:    Regular rate and rhythm, S1 and S2 normal, no murmur, rub   or gallop  Abdomen:     Soft, non-tender, bowel sounds active all four quadrants,    no masses, no organomegaly        Extremities:   Extremities normal, atraumatic, no cyanosis or edema  Pulses:   2+ and symmetric all extremities  Skin:   Skin color, texture, turgor normal, no rashes or lesions  Lymph nodes:   Cervical, supraclavicular, and axillary nodes normal  Neurologic:   CNII-XII  intact. Normal strength, sensation and reflexes      throughout         Assessment & Plan:  Marland KitchenMarland KitchenLindsay was seen today for annual exam.  Diagnoses and all orders for this visit:  Routine physical examination -     Hemoglobin A1c -     COMPLETE METABOLIC PANEL WITH GFR -     Lipid Panel w/reflex Direct LDL -     PSA  Essential hypertension -     hydrochlorothiazide (HYDRODIURIL) 12.5 MG tablet; Take 1 tablet (12.5 mg total) by mouth daily. -     losartan (COZAAR) 25 MG tablet; Take 1 tablet (25 mg total) by mouth daily. -     COMPLETE METABOLIC PANEL WITH GFR  Mixed hyperlipidemia -     atorvastatin (LIPITOR) 40 MG tablet; Take 1 tablet (40 mg total) by mouth daily. -     Lipid Panel w/reflex Direct LDL  Elevated fasting glucose -     Hemoglobin A1c  Prostate cancer screening -     PSA  Need for pneumococcal vaccine -     Pneumococcal polysaccharide vaccine 23-valent greater than or equal to 2yo subcutaneous/IM  Ulcerative rectosigmoiditis with rectal bleeding (HCC)  Need for shingles vaccine -     AMBULATORY NON FORMULARY MEDICATION; shingrix 2 doses for shingles prevention.  .. Depression screen [facility] 2/9 10/02/2018 10/08/2017 11/24/2016  Decreased Interest 0 0 0  Down, Depressed, Hopeless 0 0 0  PHQ - 2 Score 0 0 0  Altered sleeping 0 0 -  Tired, decreased energy 0 0 -  Change in appetite 0 0 -  Feeling bad or failure about yourself  0 0 -  Trouble concentrating 0 0 -  Moving slowly or fidgety/restless 0 0 -  Suicidal thoughts 0 0 -  PHQ-9 Score 0 0 -  Difficult doing work/chores Not difficult at all - -   .Marland KitchenStart a regular exercise program and make sure you are eating a healthy diet Try to eat 4 servings of dairy a day or take a calcium supplement (500mg  twice a day). Colonoscopy up to date.  Pneumonia 23 given today.  Discussed shingrix. Printed rx. Fasting labs ordered.   Will abstract screening done such as carotid doppler, AAA, ABI.   [name].IPSS Questionnaire  (AUA-7): Over the past  month.   1)  How often have you had a sensation of not emptying your bladder completely after you finish urinating?  0 - Not at all  2)  How often have you had to urinate again less than two hours after you finished urinating? 0 - Not at all  3)  How often have you found you stopped and started again several times when you urinated?  1 - Less than 1 time in 5  4) How difficult have you found it to postpone urination?  0 - Not at all  5) How often have you had a weak urinary stream?  0 - Not at all  6) How often have you had to push or strain to begin urination?  0 - Not at all  7) How many times did you most typically get up to urinate from the time you went to bed until the time you got up in the morning?  1 - 1 time  Total score:  0-7 mildly symptomatic   8-19 moderately symptomatic   20-35 severely symptomatic

## 2018-10-04 DIAGNOSIS — R7301 Impaired fasting glucose: Secondary | ICD-10-CM | POA: Diagnosis not present

## 2018-10-04 DIAGNOSIS — Z0000 Encounter for general adult medical examination without abnormal findings: Secondary | ICD-10-CM | POA: Diagnosis not present

## 2018-10-04 DIAGNOSIS — I10 Essential (primary) hypertension: Secondary | ICD-10-CM | POA: Diagnosis not present

## 2018-10-04 DIAGNOSIS — Z125 Encounter for screening for malignant neoplasm of prostate: Secondary | ICD-10-CM | POA: Diagnosis not present

## 2018-10-04 DIAGNOSIS — E782 Mixed hyperlipidemia: Secondary | ICD-10-CM | POA: Diagnosis not present

## 2018-10-05 LAB — COMPLETE METABOLIC PANEL WITH GFR
AG Ratio: 1.8 (calc) (ref 1.0–2.5)
ALT: 28 U/L (ref 9–46)
AST: 19 U/L (ref 10–35)
Albumin: 4.4 g/dL (ref 3.6–5.1)
Alkaline phosphatase (APISO): 48 U/L (ref 35–144)
BUN: 20 mg/dL (ref 7–25)
CO2: 30 mmol/L (ref 20–32)
Calcium: 9.9 mg/dL (ref 8.6–10.3)
Chloride: 98 mmol/L (ref 98–110)
Creat: 1.01 mg/dL (ref 0.70–1.25)
GFR, Est African American: 89 mL/min/{1.73_m2} (ref >=60)
GFR, Est Non African American: 77 mL/min/{1.73_m2} (ref >=60)
Globulin: 2.4 g/dL (calc) (ref 1.9–3.7)
Glucose, Bld: 81 mg/dL (ref 65–99)
Potassium: 3.9 mmol/L (ref 3.5–5.3)
Sodium: 136 mmol/L (ref 135–146)
Total Bilirubin: 0.9 mg/dL (ref 0.2–1.2)
Total Protein: 6.8 g/dL (ref 6.1–8.1)

## 2018-10-05 LAB — HEMOGLOBIN A1C
Hgb A1c MFr Bld: 5.6 % of total Hgb (ref <5.7)
Mean Plasma Glucose: 114 (calc)
eAG (mmol/L): 6.3 (calc)

## 2018-10-05 LAB — LIPID PANEL W/REFLEX DIRECT LDL
Cholesterol: 178 mg/dL (ref <200)
HDL: 87 mg/dL (ref >=40)
LDL Cholesterol (Calc): 70 mg/dL (calc)
Non-HDL Cholesterol (Calc): 91 mg/dL (calc) (ref <130)
Total CHOL/HDL Ratio: 2 (calc) (ref <5.0)
Triglycerides: 126 mg/dL (ref <150)

## 2018-10-05 LAB — PSA: PSA: 9.9 ng/mL — ABNORMAL HIGH (ref <=4.0)

## 2018-10-07 ENCOUNTER — Encounter: Payer: Self-pay | Admitting: Physician Assistant

## 2018-10-07 DIAGNOSIS — R972 Elevated prostate specific antigen [PSA]: Secondary | ICD-10-CM

## 2018-10-07 NOTE — Progress Notes (Signed)
Call pt: a1c is up a hair but still in normal range. Continue to watch sugars and carbs. Cholesterol looks fantastic. Kidney, liver, glucose look great.   Your PSA shot way up. 1 year ago 1.0 today 9.9. Are you  having any increase urinary issues? Weak stream? Frequent urination etc?   We need to send to urology to further evaluate. Are you ok with ?

## 2018-10-07 NOTE — Telephone Encounter (Signed)
Urology referral pended

## 2018-10-26 ENCOUNTER — Other Ambulatory Visit: Payer: Self-pay | Admitting: Physician Assistant

## 2018-10-26 DIAGNOSIS — I10 Essential (primary) hypertension: Secondary | ICD-10-CM

## 2018-10-28 NOTE — Telephone Encounter (Signed)
Please advise 

## 2018-11-05 DIAGNOSIS — N400 Enlarged prostate without lower urinary tract symptoms: Secondary | ICD-10-CM | POA: Diagnosis not present

## 2018-11-05 DIAGNOSIS — R972 Elevated prostate specific antigen [PSA]: Secondary | ICD-10-CM | POA: Diagnosis not present

## 2018-11-05 DIAGNOSIS — N390 Urinary tract infection, site not specified: Secondary | ICD-10-CM | POA: Diagnosis not present

## 2018-11-06 DIAGNOSIS — R69 Illness, unspecified: Secondary | ICD-10-CM | POA: Diagnosis not present

## 2018-11-07 DIAGNOSIS — K51311 Ulcerative (chronic) rectosigmoiditis with rectal bleeding: Secondary | ICD-10-CM | POA: Diagnosis not present

## 2018-11-07 DIAGNOSIS — R152 Fecal urgency: Secondary | ICD-10-CM | POA: Diagnosis not present

## 2019-01-22 ENCOUNTER — Other Ambulatory Visit: Payer: Self-pay

## 2019-01-22 ENCOUNTER — Ambulatory Visit (INDEPENDENT_AMBULATORY_CARE_PROVIDER_SITE_OTHER): Payer: Medicare HMO | Admitting: Physician Assistant

## 2019-01-22 VITALS — BP 139/78 | HR 89 | Temp 98.5°F | Ht 70.0 in | Wt 167.0 lb

## 2019-01-22 DIAGNOSIS — Z23 Encounter for immunization: Secondary | ICD-10-CM | POA: Diagnosis not present

## 2019-01-22 NOTE — Progress Notes (Signed)
Patient presents to clinic for high dose flu vaccine. He denies any allergies to latex or eggs, and has not had any reaction to the flu vaccine in the past. He denies any fever in the last 48 hours or other signs of sickness. Patient tolerated injection well in left deltoid with no immediate complications.

## 2019-01-27 ENCOUNTER — Telehealth: Payer: Self-pay | Admitting: Neurology

## 2019-01-27 MED ORDER — LOSARTAN POTASSIUM 25 MG PO TABS: 25.0000 mg | ORAL_TABLET | Freq: Every day | ORAL | 1 refills | Status: DC

## 2019-01-27 NOTE — Telephone Encounter (Signed)
Agree with plan 

## 2019-01-27 NOTE — Telephone Encounter (Signed)
Patient left vm stating his Losartan was changed to Candesartan due to Losartan on back order in July 2020. He states Candesartan makes him light headed. He wants to switch back to Losartan. [facility] confirmed they have this in stock. RX sent to pharmacy.   [place].

## 2019-03-14 DIAGNOSIS — K51311 Ulcerative (chronic) rectosigmoiditis with rectal bleeding: Secondary | ICD-10-CM | POA: Diagnosis not present

## 2019-03-14 DIAGNOSIS — R152 Fecal urgency: Secondary | ICD-10-CM | POA: Diagnosis not present

## 2019-06-05 DIAGNOSIS — R69 Illness, unspecified: Secondary | ICD-10-CM | POA: Diagnosis not present

## 2019-07-16 ENCOUNTER — Other Ambulatory Visit: Payer: Self-pay | Admitting: Physician Assistant

## 2019-07-22 ENCOUNTER — Encounter: Payer: Self-pay | Admitting: Physician Assistant

## 2019-07-22 ENCOUNTER — Ambulatory Visit (INDEPENDENT_AMBULATORY_CARE_PROVIDER_SITE_OTHER): Payer: Medicare HMO | Admitting: Physician Assistant

## 2019-07-22 ENCOUNTER — Other Ambulatory Visit: Payer: Self-pay

## 2019-07-22 VITALS — BP 151/78 | HR 83 | Ht 70.0 in | Wt 177.0 lb

## 2019-07-22 DIAGNOSIS — R972 Elevated prostate specific antigen [PSA]: Secondary | ICD-10-CM | POA: Diagnosis not present

## 2019-07-22 DIAGNOSIS — I10 Essential (primary) hypertension: Secondary | ICD-10-CM | POA: Diagnosis not present

## 2019-07-22 DIAGNOSIS — E782 Mixed hyperlipidemia: Secondary | ICD-10-CM

## 2019-07-22 DIAGNOSIS — L570 Actinic keratosis: Secondary | ICD-10-CM

## 2019-07-22 DIAGNOSIS — Z0000 Encounter for general adult medical examination without abnormal findings: Secondary | ICD-10-CM

## 2019-07-22 DIAGNOSIS — Z13220 Encounter for screening for lipoid disorders: Secondary | ICD-10-CM

## 2019-07-22 DIAGNOSIS — R030 Elevated blood-pressure reading, without diagnosis of hypertension: Secondary | ICD-10-CM

## 2019-07-22 DIAGNOSIS — Z131 Encounter for screening for diabetes mellitus: Secondary | ICD-10-CM | POA: Diagnosis not present

## 2019-07-22 LAB — CBC
HCT: 43.7 % (ref 38.5–50.0)
Hemoglobin: 14.8 g/dL (ref 13.2–17.1)
MCH: 31.4 pg (ref 27.0–33.0)
MCHC: 33.9 g/dL (ref 32.0–36.0)
MCV: 92.8 fL (ref 80.0–100.0)
MPV: 10.6 fL (ref 7.5–12.5)
Platelets: 253 10*3/uL (ref 140–400)
RBC: 4.71 10*6/uL (ref 4.20–5.80)
RDW: 12 % (ref 11.0–15.0)
WBC: 12.8 10*3/uL — ABNORMAL HIGH (ref 3.8–10.8)

## 2019-07-22 LAB — COMPLETE METABOLIC PANEL WITH GFR
AG Ratio: 1.8 (calc) (ref 1.0–2.5)
ALT: 29 U/L (ref 9–46)
AST: 16 U/L (ref 10–35)
Albumin: 4.1 g/dL (ref 3.6–5.1)
Alkaline phosphatase (APISO): 65 U/L (ref 35–144)
BUN: 20 mg/dL (ref 7–25)
CO2: 29 mmol/L (ref 20–32)
Calcium: 9.8 mg/dL (ref 8.6–10.3)
Chloride: 102 mmol/L (ref 98–110)
Creat: 0.98 mg/dL (ref 0.70–1.25)
GFR, Est African American: 93 mL/min/{1.73_m2} (ref >=60)
GFR, Est Non African American: 80 mL/min/{1.73_m2} (ref >=60)
Globulin: 2.3 g/dL (calc) (ref 1.9–3.7)
Glucose, Bld: 70 mg/dL (ref 65–99)
Potassium: 4.1 mmol/L (ref 3.5–5.3)
Sodium: 139 mmol/L (ref 135–146)
Total Bilirubin: 0.6 mg/dL (ref 0.2–1.2)
Total Protein: 6.4 g/dL (ref 6.1–8.1)

## 2019-07-22 LAB — LIPID PANEL W/REFLEX DIRECT LDL
Cholesterol: 146 mg/dL (ref <200)
HDL: 65 mg/dL (ref >=40)
LDL Cholesterol (Calc): 61 mg/dL (calc)
Non-HDL Cholesterol (Calc): 81 mg/dL (calc) (ref <130)
Total CHOL/HDL Ratio: 2.2 (calc) (ref <5.0)
Triglycerides: 122 mg/dL (ref <150)

## 2019-07-22 LAB — PSA: PSA: 1 ng/mL (ref <=4.0)

## 2019-07-22 MED ORDER — LOSARTAN POTASSIUM 25 MG PO TABS: 25.0000 mg | ORAL_TABLET | Freq: Every day | ORAL | 3 refills | Status: DC

## 2019-07-22 MED ORDER — ATORVASTATIN CALCIUM 40 MG PO TABS: 40.0000 mg | ORAL_TABLET | Freq: Every day | ORAL | 3 refills | Status: DC

## 2019-07-22 MED ORDER — HYDROCHLOROTHIAZIDE 12.5 MG PO TABS: 12.5000 mg | ORAL_TABLET | Freq: Every day | ORAL | 3 refills | Status: DC

## 2019-07-22 NOTE — Patient Instructions (Signed)
Health Maintenance After Age 67 After age 67, you are at a higher risk for certain long-term diseases and infections as well as injuries from falls. Falls are a major cause of broken bones and head injuries in people who are older than age 67. Getting regular preventive care can help to keep you healthy and well. Preventive care includes getting regular testing and making lifestyle changes as recommended by your health care provider. Talk with your health care provider about:  Which screenings and tests you should have. A screening is a test that checks for a disease when you have no symptoms.  A diet and exercise plan that is right for you. What should I know about screenings and tests to prevent falls? Screening and testing are the best ways to find a health problem early. Early diagnosis and treatment give you the best chance of managing medical conditions that are common after age 67. Certain conditions and lifestyle choices may make you more likely to have a fall. Your health care provider may recommend:  Regular vision checks. Poor vision and conditions such as cataracts can make you more likely to have a fall. If you wear glasses, make sure to get your prescription updated if your vision changes.  Medicine review. Work with your health care provider to regularly review all of the medicines you are taking, including over-the-counter medicines. Ask your health care provider about any side effects that may make you more likely to have a fall. Tell your health care provider if any medicines that you take make you feel dizzy or sleepy.  Osteoporosis screening. Osteoporosis is a condition that causes the bones to get weaker. This can make the bones weak and cause them to break more easily.  Blood pressure screening. Blood pressure changes and medicines to control blood pressure can make you feel dizzy.  Strength and balance checks. Your health care provider may recommend certain tests to check your  strength and balance while standing, walking, or changing positions.  Foot health exam. Foot pain and numbness, as well as not wearing proper footwear, can make you more likely to have a fall.  Depression screening. You may be more likely to have a fall if you have a fear of falling, feel emotionally low, or feel unable to do activities that you used to do.  Alcohol use screening. Using too much alcohol can affect your balance and may make you more likely to have a fall. What actions can I take to lower my risk of falls? General instructions  Talk with your health care provider about your risks for falling. Tell your health care provider if: ? You fall. Be sure to tell your health care provider about all falls, even ones that seem minor. ? You feel dizzy, sleepy, or off-balance.  Take over-the-counter and prescription medicines only as told by your health care provider. These include any supplements.  Eat a healthy diet and maintain a healthy weight. A healthy diet includes low-fat dairy products, low-fat (lean) meats, and fiber from whole grains, beans, and lots of fruits and vegetables. Home safety  Remove any tripping hazards, such as rugs, cords, and clutter.  Install safety equipment such as grab bars in bathrooms and safety rails on stairs.  Keep rooms and walkways well-lit. Activity   Follow a regular exercise program to stay fit. This will help you maintain your balance. Ask your health care provider what types of exercise are appropriate for you.  If you need a cane or   walker, use it as recommended by your health care provider.  Wear supportive shoes that have nonskid soles. Lifestyle  Do not drink alcohol if your health care provider tells you not to drink.  If you drink alcohol, limit how much you have: ? 0-1 drink a day for women. ? 0-2 drinks a day for men.  Be aware of how much alcohol is in your drink. In the [place] one drink equals one typical bottle of beer (12  oz), one-half glass of wine (5 oz), or one shot of hard liquor (1 oz).  Do not use any products that contain nicotine or tobacco, such as cigarettes and e-cigarettes. If you need help quitting, ask your health care provider. Summary  Having a healthy lifestyle and getting preventive care can help to protect your health and wellness after age 67.  Screening and testing are the best way to find a health problem early and help you avoid having a fall. Early diagnosis and treatment give you the best chance for managing medical conditions that are more common for people who are older than age 67.  Falls are a major cause of broken bones and head injuries in people who are older than age 67. Take precautions to prevent a fall at home.  Work with your health care provider to learn what changes you can make to improve your health and wellness and to prevent falls. This information is not intended to replace advice given to you by your health care provider. Make sure you discuss any questions you have with your health care provider. Document Revised: 07/18/2018 Document Reviewed: 02/07/2017 Elsevier Patient Education  2020 Elsevier Inc.  

## 2019-07-22 NOTE — Progress Notes (Addendum)
Subjective:    Patient ID: Daniel Baldwin, male    DOB: Sep 19, 1950, 69 y.o.   MRN: [id number]  HPI  Pt is a 69 yo male with UC, HLD, HTN, elevated PSA who presents to the clinic for annual exam.   Pt had shingles and covid vaccines.   He has no concerns or complaints. His elevated PSA was a lab error. Rechecked at urology and back down to normal.   Ongoing UC flare. Currently on prednisone.    .. Active Ambulatory Problems    Diagnosis Date Noted  . Essential hypertension 01/21/2014  . Ulnar neuropathy of right upper extremity 01/21/2014  . Hyperlipidemia 01/21/2014  . Diarrhea 03/29/2016  . Blood in stool 03/29/2016  . Elevated fasting glucose 01/03/2017  . Former smoker 10/10/2017  . Ulcerative proctitis with rectal bleeding ([place]) 07/25/2018  . Thrombosed external hemorrhoid 07/25/2018  . Elevated PSA 10/07/2018  . Actinic keratosis 07/23/2019   Resolved Ambulatory Problems    Diagnosis Date Noted  . No Resolved Ambulatory Problems   Past Medical History:  Diagnosis Date  . Hypertension   . UC (ulcerative colitis) ([place])    .[name] Family History  Problem Relation Age of Onset  . Hypertension Mother   . Hypertension Father    .[name] Social History   Socioeconomic History  . Marital status: Married    Spouse name: Not on file  . Number of children: Not on file  . Years of education: Not on file  . Highest education level: Not on file  Occupational History  . Not on file  Tobacco Use  . Smoking status: Former Smoker    Quit date: 08/21/2001    Years since quitting: 17.9  . Smokeless tobacco: Never Used  Substance and Sexual Activity  . Alcohol use: Yes  . Drug use: No  . Sexual activity: Not on file  Other Topics Concern  . Not on file  Social History Narrative  . Not on file   Social Determinants of Health   Financial Resource Strain:   . Difficulty of Paying Living Expenses:   Food Insecurity:   . Worried About Charity fundraiser in the Last Year:   .  Arboriculturist in the Last Year:   Transportation Needs:   . Film/video editor (Medical):   [name] Lack of Transportation (Non-Medical):   Physical Activity:   . Days of Exercise per Week:   . Minutes of Exercise per Session:   Stress:   . Feeling of Stress :   Social Connections:   . Frequency of Communication with Friends and Family:   . Frequency of Social Gatherings with Friends and Family:   . Attends Religious Services:   . Active Member of Clubs or Organizations:   . Attends Archivist Meetings:   [name] Marital Status:   Intimate Partner Violence:   . Fear of Current or Ex-Partner:   . Emotionally Abused:   [name] Physically Abused:   . Sexually Abused:       Review of Systems  All other systems reviewed and are negative.      Objective:   Physical Exam BP (!) 151/78   Pulse 83   Ht 5\' 10"  (1.778 m)   Wt 177 lb (80.3 kg)   SpO2 99%   BMI 25.40 kg/m   General Appearance:    Alert, cooperative, no distress, appears stated age  Head:    Normocephalic, without obvious abnormality, atraumatic  Eyes:  PERRL, conjunctiva/corneas clear, EOM's intact, fundi    benign, both eyes       Ears:    Normal TM's and external ear canals, both ears  Nose:   Nares normal, septum midline, mucosa normal, no drainage    or sinus tenderness  Throat:   Lips, mucosa, and tongue normal; teeth and gums normal  Neck:   Supple, symmetrical, trachea midline, no adenopathy;       thyroid:  No enlargement/tenderness/nodules; no carotid   bruit or JVD  Back:     Symmetric, no curvature, ROM normal, no CVA tenderness  Lungs:     Clear to auscultation bilaterally, respirations unlabored  Chest wall:    No tenderness or deformity  Heart:    Regular rate and rhythm, S1 and S2 normal, no murmur, rub   or gallop  Abdomen:     Soft, non-tender, bowel sounds active all four quadrants,    no masses, no organomegaly        Extremities:   Extremities normal, atraumatic, no cyanosis or edema   Pulses:   2+ and symmetric all extremities  Skin:   Erythematous scaly lesions on right temple.   Lymph nodes:   Cervical, supraclavicular, and axillary nodes normal  Neurologic:   CNII-XII intact. Normal strength, sensation and reflexes      throughout         Assessment & Plan:  Marland KitchenMarland KitchenOr was seen today for annual exam.  Diagnoses and all orders for this visit:  Routine physical examination -     PSA -     COMPLETE METABOLIC PANEL WITH GFR -     Lipid Panel w/reflex Direct LDL -     CBC  Elevated PSA -     PSA -     CBC  Screening for diabetes mellitus -     COMPLETE METABOLIC PANEL WITH GFR  Screening for lipid disorders -     Lipid Panel w/reflex Direct LDL  Essential hypertension -     losartan (COZAAR) 25 MG tablet; Take 1 tablet (25 mg total) by mouth daily. -     hydrochlorothiazide (HYDRODIURIL) 12.5 MG tablet; Take 1 tablet (12.5 mg total) by mouth daily.  Mixed hyperlipidemia -     atorvastatin (LIPITOR) 40 MG tablet; Take 1 tablet (40 mg total) by mouth daily.  Elevated blood pressure reading  Actinic keratosis -     Ambulatory referral to Dermatology   .[name] Depression screen [facility] 2/9 07/22/2019 10/02/2018 10/08/2017 11/24/2016  Decreased Interest 0 0 0 0  Down, Depressed, Hopeless 0 0 0 0  PHQ - 2 Score 0 0 0 0  Altered sleeping 0 0 0 -  Tired, decreased energy 0 0 0 -  Change in appetite 0 0 0 -  Feeling bad or failure about yourself  0 0 0 -  Trouble concentrating 0 0 0 -  Moving slowly or fidgety/restless 0 0 0 -  Suicidal thoughts 0 0 0 -  PHQ-9 Score 0 0 0 -  Difficult doing work/chores Not difficult at all Not difficult at all - -   .[name] GAD 7 : Generalized Anxiety Score 07/22/2019 10/02/2018 10/08/2017  Nervous, Anxious, on Edge 0 0 0  Control/stop worrying 0 0 0  Worry too much - different things 0 0 0  Trouble relaxing 0 0 0  Restless 0 0 0  Easily annoyed or irritable 0 0 0  Afraid - awful might happen 0 0 0  Total GAD  7 Score 0 0 0   Anxiety Difficulty Not difficult at all Not difficult at all -    .Marland KitchenStart a regular exercise program and make sure you are eating a healthy diet Try to eat 4 servings of dairy a day or take a calcium supplement (500mg  twice a day). Your vaccines are up to date.  Fasting labs ordered.  Colonoscopy UTD.  BP elevated today. Checks at home in 120's over 80s. He is also on prednisone.   .. [place] Name 07/22/19 0900         During the last Month   Sensation of Bladder not Empty  Not at all     Urinate<2 hours after last  Not at all     Mult. stop/start when voiding  Not at all     Difficult to postpone voiding  Not at all     Weak urinary stream  Not at all     Push/strain to begin urination  Not at all     Times per night up to urinate  Less than 1 time in 5       OTHER   Total Score  1        AKs noted on face. Pt has fair skin. Sent to derm for full body check and cryotherapy of AKs.  Follow up in 1 year.

## 2019-07-23 DIAGNOSIS — L570 Actinic keratosis: Secondary | ICD-10-CM | POA: Insufficient documentation

## 2019-07-23 NOTE — Progress Notes (Signed)
Call pt:  PSA great.  Kidney, liver, glucose look great.  Cholesterol looks fantastic.  WBC up due to prednisone you are on.   Labs look amazing!  -Luvenia Starch

## 2019-07-23 NOTE — Addendum Note (Signed)
Addended by: Donella Stade on: 07/23/2019 06:03 AM   Modules accepted: Orders

## 2019-08-04 DIAGNOSIS — I10 Essential (primary) hypertension: Secondary | ICD-10-CM | POA: Diagnosis not present

## 2019-08-04 DIAGNOSIS — Z0100 Encounter for examination of eyes and vision without abnormal findings: Secondary | ICD-10-CM | POA: Diagnosis not present

## 2019-08-04 DIAGNOSIS — H2513 Age-related nuclear cataract, bilateral: Secondary | ICD-10-CM | POA: Diagnosis not present

## 2019-08-04 DIAGNOSIS — E7800 Pure hypercholesterolemia, unspecified: Secondary | ICD-10-CM | POA: Diagnosis not present

## 2019-08-04 DIAGNOSIS — Z135 Encounter for screening for eye and ear disorders: Secondary | ICD-10-CM | POA: Diagnosis not present

## 2019-08-04 DIAGNOSIS — H5210 Myopia, unspecified eye: Secondary | ICD-10-CM | POA: Diagnosis not present

## 2019-08-05 DIAGNOSIS — Z111 Encounter for screening for respiratory tuberculosis: Secondary | ICD-10-CM | POA: Diagnosis not present

## 2019-08-05 DIAGNOSIS — R152 Fecal urgency: Secondary | ICD-10-CM | POA: Diagnosis not present

## 2019-08-05 DIAGNOSIS — K51311 Ulcerative (chronic) rectosigmoiditis with rectal bleeding: Secondary | ICD-10-CM | POA: Diagnosis not present

## 2019-08-13 DIAGNOSIS — R69 Illness, unspecified: Secondary | ICD-10-CM | POA: Diagnosis not present

## 2019-08-14 DIAGNOSIS — K51311 Ulcerative (chronic) rectosigmoiditis with rectal bleeding: Secondary | ICD-10-CM | POA: Diagnosis not present

## 2019-09-15 ENCOUNTER — Encounter: Payer: Self-pay | Admitting: Physician Assistant

## 2019-09-15 ENCOUNTER — Ambulatory Visit: Payer: Medicare HMO | Admitting: Physician Assistant

## 2019-09-15 ENCOUNTER — Other Ambulatory Visit: Payer: Self-pay

## 2019-09-15 DIAGNOSIS — D485 Neoplasm of uncertain behavior of skin: Secondary | ICD-10-CM | POA: Diagnosis not present

## 2019-09-15 DIAGNOSIS — L570 Actinic keratosis: Secondary | ICD-10-CM | POA: Diagnosis not present

## 2019-09-15 DIAGNOSIS — L821 Other seborrheic keratosis: Secondary | ICD-10-CM

## 2019-09-15 DIAGNOSIS — D0439 Carcinoma in situ of skin of other parts of face: Secondary | ICD-10-CM | POA: Diagnosis not present

## 2019-09-15 DIAGNOSIS — C4359 Malignant melanoma of other part of trunk: Secondary | ICD-10-CM | POA: Diagnosis not present

## 2019-09-15 DIAGNOSIS — K51311 Ulcerative (chronic) rectosigmoiditis with rectal bleeding: Secondary | ICD-10-CM | POA: Diagnosis not present

## 2019-09-15 NOTE — Progress Notes (Addendum)
   New Patient Visit  Subjective  Daniel Baldwin is a 69 y.o. male who presents for the following: Skin Problem (x months right temple crust). PA recommended he come to the Derm when she noticed a crusting area on his right temple that has been there for years. It has been there but has not seemed to get worse. It is not sensitive and doesn't burn or sting. He has a bump under his left eye that has been there for years. He also has one on the right medial [place] area that seems to have a crust on it and maybe a similar one above the left eyebrow that is newer. None of those are painful or have bled. No history of skin cancer.  Patient had 3 biopsies done in 2011 at [facility]. One was from the left back and showed Seborrheic Keratosis. (Path is in media) I asked him about the irregular lesion on his back and he feels like this is the same lesion they worked on in 2011. There is a scar, however, adjacent to the lesion I am concerned with today.   Objective  Well appearing patient in no apparent distress; mood and affect are within normal limits.  All skin waist up examined.  Objective  Left Temple, Mid Parietal Scalp: Erythematous patches with gritty scale.  Objective  Mid Back: Large brown lesion with irregular shape and color.         Objective  Right Temple: Crusted lesion right temple.         Objective  Left Malar Cheek, Right Root of Nose: Stuck-on, waxy papules and plaques.   Assessment & Plan  AK (actinic keratosis) (2) Mid Parietal Scalp; Left Temple  We discussed cryo vs 5FU cream for these areas and have decided to wait on the biopsy from the temple to decide what to do next.   Neoplasm of uncertain behavior of skin (2) Mid Back  Skin / nail biopsy Type of biopsy: tangential   Informed consent: discussed and consent obtained   Timeout: patient name, date of birth, surgical site, and procedure verified   Procedure prep:  Patient was  prepped and draped in usual sterile fashion (Non sterile) Prep type:  Chlorhexidine Anesthesia: the lesion was anesthetized in a standard fashion   Anesthetic:  1% lidocaine w/ epinephrine 1-100,000 local infiltration Instrument used: flexible razor blade   Outcome: patient tolerated procedure well   Post-procedure details: wound care instructions given    Specimen 1 - Surgical pathology Differential Diagnosis: ATYPIA Check Margins: YES  Right Temple  Skin / nail biopsy Type of biopsy: tangential   Informed consent: discussed and consent obtained   Timeout: patient name, date of birth, surgical site, and procedure verified   Procedure prep:  Patient was prepped and draped in usual sterile fashion (Non sterile) Prep type:  Chlorhexidine Anesthesia: the lesion was anesthetized in a standard fashion   Anesthetic:  1% lidocaine w/ epinephrine 1-100,000 local infiltration Instrument used: flexible razor blade   Outcome: patient tolerated procedure well   Post-procedure details: wound care instructions given    Specimen 2 - Surgical pathology Differential Diagnosis: BCC SCC Check Margins: No  Seborrheic keratosis (2) Right Root of Nose; Left Malar Cheek

## 2019-09-22 ENCOUNTER — Telehealth: Payer: Self-pay | Admitting: Physician Assistant

## 2019-09-22 NOTE — Telephone Encounter (Signed)
Patient is returning call from [facility] for results.  Patient says he received call on Thursday 09/18/2019.

## 2019-09-22 NOTE — Telephone Encounter (Signed)
Path to pt. Suggested he be seen by [facility] surgeon while in [place] for his melanoma. He does not want it treated in [place] and would like to be seen by [facility] here [place] or [place]) and plans to return from [place] by August 15. Discussed unknown risk without final path. His SCC in situ can be scheduled here with me in the fall once his melanoma is taken care of.

## 2019-09-22 NOTE — Telephone Encounter (Signed)
See patient's message.

## 2019-09-24 NOTE — Telephone Encounter (Signed)
Phone call to [facility] per [employer] request regarding the patient's pathology results.  Voicemail left for Dr. De Hollingshead assistant Evlyn Clines with what Carolan Shiver request.

## 2019-09-24 NOTE — Telephone Encounter (Signed)
-----   Message from Arlyss Gandy, [place] sent at 09/24/2019  2:41 PM EDT ----- Can you call the lab and see if they can run a couple more sections on this specimen just to double check on the correct grading of this melanoma. It was a large lesion and the most suspicious area was on one end and not centrally.

## 2019-10-29 ENCOUNTER — Telehealth: Payer: Self-pay | Admitting: Physician Assistant

## 2019-10-29 NOTE — Telephone Encounter (Signed)
Sent referral to skin surgery center, Told patient to call us back once he has appointment date and time so that we could schedule the squamous cell carcinoma to be treated on his right temple. Patient understood

## 2019-10-29 NOTE — Telephone Encounter (Signed)
Patient calling to get update on referral to [facility]. He will be back home from [place] next week.

## 2019-11-05 ENCOUNTER — Telehealth: Payer: Self-pay | Admitting: Physician Assistant

## 2019-11-05 NOTE — Telephone Encounter (Signed)
Patient is waiting on a referral appoint to [place].  Patient says that he was told by our nurse last week that if he had not heard from [place] about appointment date and time that he should call us back to check on status.

## 2019-11-07 NOTE — Telephone Encounter (Signed)
Patient aware I spoke with skin surgery and they are working on the referrral # [id number]

## 2019-11-13 DIAGNOSIS — L989 Disorder of the skin and subcutaneous tissue, unspecified: Secondary | ICD-10-CM | POA: Diagnosis not present

## 2019-11-13 DIAGNOSIS — D0359 Melanoma in situ of other part of trunk: Secondary | ICD-10-CM | POA: Diagnosis not present

## 2019-12-03 ENCOUNTER — Encounter: Payer: Self-pay | Admitting: Physician Assistant

## 2019-12-03 DIAGNOSIS — C439 Malignant melanoma of skin, unspecified: Secondary | ICD-10-CM | POA: Insufficient documentation

## 2019-12-22 ENCOUNTER — Ambulatory Visit (INDEPENDENT_AMBULATORY_CARE_PROVIDER_SITE_OTHER): Payer: Medicare HMO | Admitting: Sports Medicine

## 2019-12-22 ENCOUNTER — Ambulatory Visit (INDEPENDENT_AMBULATORY_CARE_PROVIDER_SITE_OTHER): Payer: Medicare HMO

## 2019-12-22 ENCOUNTER — Other Ambulatory Visit: Payer: Self-pay

## 2019-12-22 DIAGNOSIS — M7542 Impingement syndrome of left shoulder: Secondary | ICD-10-CM

## 2019-12-22 DIAGNOSIS — M25512 Pain in left shoulder: Secondary | ICD-10-CM | POA: Diagnosis not present

## 2019-12-22 MED ORDER — MELOXICAM 15 MG PO TABS: ORAL_TABLET | ORAL | 3 refills | Status: DC

## 2019-12-22 NOTE — Assessment & Plan Note (Signed)
This is a very pleasant 69 year old male with impingement symptoms, left shoulder, worse over the deltoid and with overhead activities, he has good rotator cuff strength. We will start conservatively, home rehab exercises, meloxicam, x-rays. If insufficient improvement over 6 weeks we will proceed with an injection and formal physical therapy.

## 2019-12-22 NOTE — Progress Notes (Signed)
° ° °  Procedures performed today:    None.  Independent interpretation of notes and tests performed by another provider:   None.  Brief History, Exam, Impression, and Recommendations:    Daniel Baldwin is a pleasant 69yo male who presents today with left shoulder pain. He reports that he may have aggravated it while removing carpeting about 2 months ago. He was minimally painful on [place] and [place]. This seems like a tendinitis of his rotator cuff. We are going to start with at home PT and meloxicam to reduce inflammation. We will see him in about 4-6 weeks for reevaluation and possible injection if not any better.   Marcelino Duster, MS3   ___________________________________________ Gwen Her. Dianah Field, M.D., ABFM., CAQSM. Primary Care and [place] Instructor of [employer] [facility] [employer]

## 2020-01-27 DIAGNOSIS — R69 Illness, unspecified: Secondary | ICD-10-CM | POA: Diagnosis not present

## 2020-01-28 ENCOUNTER — Ambulatory Visit: Payer: Medicare HMO | Admitting: Physician Assistant

## 2020-02-02 ENCOUNTER — Ambulatory Visit: Payer: Medicare HMO | Admitting: Sports Medicine

## 2020-03-19 DIAGNOSIS — R197 Diarrhea, unspecified: Secondary | ICD-10-CM | POA: Diagnosis not present

## 2020-03-19 DIAGNOSIS — R152 Fecal urgency: Secondary | ICD-10-CM | POA: Diagnosis not present

## 2020-03-19 DIAGNOSIS — K5130 Ulcerative (chronic) rectosigmoiditis without complications: Secondary | ICD-10-CM | POA: Diagnosis not present

## 2020-05-05 DIAGNOSIS — R1904 Left lower quadrant abdominal swelling, mass and lump: Secondary | ICD-10-CM | POA: Diagnosis not present

## 2020-05-05 DIAGNOSIS — K51318 Ulcerative (chronic) rectosigmoiditis with other complication: Secondary | ICD-10-CM | POA: Diagnosis not present

## 2020-05-05 DIAGNOSIS — I10 Essential (primary) hypertension: Secondary | ICD-10-CM | POA: Diagnosis not present

## 2020-05-10 DIAGNOSIS — K51318 Ulcerative (chronic) rectosigmoiditis with other complication: Secondary | ICD-10-CM | POA: Diagnosis not present

## 2020-05-12 DIAGNOSIS — K5130 Ulcerative (chronic) rectosigmoiditis without complications: Secondary | ICD-10-CM | POA: Diagnosis not present

## 2020-05-18 ENCOUNTER — Encounter: Payer: Self-pay | Admitting: Physician Assistant

## 2020-05-25 ENCOUNTER — Encounter: Payer: Self-pay | Admitting: Physician Assistant

## 2020-05-25 ENCOUNTER — Telehealth (INDEPENDENT_AMBULATORY_CARE_PROVIDER_SITE_OTHER): Payer: Medicare HMO | Admitting: Physician Assistant

## 2020-05-25 VITALS — BP 189/90 | HR 72 | Ht 70.0 in | Wt 185.0 lb

## 2020-05-25 DIAGNOSIS — I10 Essential (primary) hypertension: Secondary | ICD-10-CM | POA: Diagnosis not present

## 2020-05-25 NOTE — Progress Notes (Signed)
Patient ID: Daniel Baldwin, male   DOB: 11-20-1950, 70 y.o.   MRN: [id number] .Marland KitchenVirtual Visit via Telephone Note  I connected with Daniel Baldwin on 05/25/20 at  8:30 AM EST by telephone and verified that I am speaking with the correct person using two identifiers.  Location: Patient: home Provider: clinic  .Marland KitchenParticipating in visit:  Patient: Daniel Baldwin Provider: Iran Planas [facility]   I discussed the limitations, risks, security and privacy concerns of performing an evaluation and management service by telephone and the availability of in person appointments. I also discussed with the patient that there may be a patient responsible charge related to this service. The patient expressed understanding and agreed to proceed.   History of Present Illness: Patient is a 70 year old male with ulcerative colitis and hypertension who calls into the clinic with elevated blood pressure readings.  He has been noticing that his blood pressure has been higher than normal.  He went to digestive health for his infusion and his blood pressure was in the 180s over 90s.  On average he is checking and they are 150s over 90s.  He denies any chest pain, palpitations, headaches, flushing.  He has had a few times where he is felt a little short of breath.  He denies any extremity edema.  Other than his ulcerative colitis treatments changing no other changes to medications.  He is on hydrochlorothiazide and losartan daily.  He has been compliant with his medications.   .. Active Ambulatory Problems    Diagnosis Date Noted  . Essential hypertension 01/21/2014  . Ulnar neuropathy of right upper extremity 01/21/2014  . Hyperlipidemia 01/21/2014  . Diarrhea 03/29/2016  . Blood in stool 03/29/2016  . Elevated fasting glucose 01/03/2017  . Former smoker 10/10/2017  . Ulcerative proctitis with rectal bleeding ([place]) 07/25/2018  . Thrombosed external hemorrhoid 07/25/2018  . Elevated PSA 10/07/2018  . Actinic keratosis  07/23/2019  . Malignant melanoma ([place]) 12/03/2019  . Impingement syndrome, shoulder, left 12/22/2019   Resolved Ambulatory Problems    Diagnosis Date Noted  . No Resolved Ambulatory Problems   Past Medical History:  Diagnosis Date  . Hypertension   . UC (ulcerative colitis) ([place])    Reviewed med, allergy, problem list.     Observations/Objective: No acute distress Normal breathing Normal mood.   .. Today's Vitals   05/25/20 0818  BP: (!) 189/90  Pulse: 72  Weight: 185 lb (83.9 kg)  Height: 5\' 10"  (1.778 m)   Body mass index is 26.54 kg/m.   Assessment and Plan: Marland KitchenMarland KitchenJayquon was seen today for hypertension.  Diagnoses and all orders for this visit:  Essential hypertension   Not controlled. Increase losartan to 50mg  daily and continue on HCTZ 12.5mg . we may need to increase HCTZ to 25mg  in near future. Pt is asymptomatic. Discussed if symptomatic please reach out. Discussed diet changes with lowering sodium and trying to walk/exercise daily. Send me BP readings over the next week. Will adjust medication accordingly.     Follow Up Instructions:    I discussed the assessment and treatment plan with the patient. The patient was provided an opportunity to ask questions and all were answered. The patient agreed with the plan and demonstrated an understanding of the instructions.   The patient was advised to call back or seek an in-person evaluation if the symptoms worsen or if the condition fails to improve as anticipated.  I provided 10 minutes of non-face-to-face time during this encounter.   Iran Planas, PA-C

## 2020-05-25 NOTE — Progress Notes (Signed)
BP has been running high  From mychart messages: "Hi Daniel Baldwin - it may be time to increase my blood pressure medication.  I am currently in [place] until April.  Last week, I was at a digestive health facility here and my blood pressure was 189/90, 176/103, and 150/90.  The local doctors said I should contact you about possibly increasing my meds.  In the last week, I have tried to minimize my salt intake.  What's your thoughts?"   Infrequently (once weekly) a little bit of lightheadedness and SOB that last about a minute and then goes away No palipitations, chest pain, headaches

## 2020-05-27 DIAGNOSIS — R1904 Left lower quadrant abdominal swelling, mass and lump: Secondary | ICD-10-CM | POA: Diagnosis not present

## 2020-05-27 DIAGNOSIS — K529 Noninfective gastroenteritis and colitis, unspecified: Secondary | ICD-10-CM | POA: Diagnosis not present

## 2020-05-27 DIAGNOSIS — K8020 Calculus of gallbladder without cholecystitis without obstruction: Secondary | ICD-10-CM | POA: Diagnosis not present

## 2020-05-27 DIAGNOSIS — K51318 Ulcerative (chronic) rectosigmoiditis with other complication: Secondary | ICD-10-CM | POA: Diagnosis not present

## 2020-05-27 DIAGNOSIS — K51918 Ulcerative colitis, unspecified with other complication: Secondary | ICD-10-CM | POA: Diagnosis not present

## 2020-05-27 DIAGNOSIS — R911 Solitary pulmonary nodule: Secondary | ICD-10-CM | POA: Diagnosis not present

## 2020-05-27 DIAGNOSIS — K769 Liver disease, unspecified: Secondary | ICD-10-CM | POA: Diagnosis not present

## 2020-05-27 DIAGNOSIS — N3289 Other specified disorders of bladder: Secondary | ICD-10-CM | POA: Diagnosis not present

## 2020-05-27 DIAGNOSIS — K639 Disease of intestine, unspecified: Secondary | ICD-10-CM | POA: Diagnosis not present

## 2020-05-27 DIAGNOSIS — N400 Enlarged prostate without lower urinary tract symptoms: Secondary | ICD-10-CM | POA: Diagnosis not present

## 2020-05-28 ENCOUNTER — Encounter: Payer: Self-pay | Admitting: Physician Assistant

## 2020-06-25 DIAGNOSIS — K5130 Ulcerative (chronic) rectosigmoiditis without complications: Secondary | ICD-10-CM | POA: Diagnosis not present

## 2020-06-28 DIAGNOSIS — K51918 Ulcerative colitis, unspecified with other complication: Secondary | ICD-10-CM | POA: Diagnosis not present

## 2020-07-02 ENCOUNTER — Encounter: Payer: Self-pay | Admitting: Physician Assistant

## 2020-07-02 MED ORDER — LOSARTAN POTASSIUM 50 MG PO TABS: 50.0000 mg | ORAL_TABLET | Freq: Every day | ORAL | 0 refills | Status: DC

## 2020-07-28 ENCOUNTER — Encounter: Payer: Self-pay | Admitting: Physician Assistant

## 2020-07-28 DIAGNOSIS — I10 Essential (primary) hypertension: Secondary | ICD-10-CM

## 2020-07-28 DIAGNOSIS — E782 Mixed hyperlipidemia: Secondary | ICD-10-CM

## 2020-07-28 MED ORDER — HYDROCHLOROTHIAZIDE 12.5 MG PO TABS: 12.5000 mg | ORAL_TABLET | Freq: Every day | ORAL | 0 refills | Status: DC

## 2020-07-28 MED ORDER — ATORVASTATIN CALCIUM 40 MG PO TABS: 40.0000 mg | ORAL_TABLET | Freq: Every day | ORAL | 0 refills | Status: DC

## 2020-07-29 ENCOUNTER — Other Ambulatory Visit: Payer: Self-pay | Admitting: Physician Assistant

## 2020-07-30 ENCOUNTER — Other Ambulatory Visit: Payer: Self-pay | Admitting: Physician Assistant

## 2020-07-30 DIAGNOSIS — E782 Mixed hyperlipidemia: Secondary | ICD-10-CM

## 2020-07-30 DIAGNOSIS — I10 Essential (primary) hypertension: Secondary | ICD-10-CM

## 2020-08-05 DIAGNOSIS — E7800 Pure hypercholesterolemia, unspecified: Secondary | ICD-10-CM | POA: Diagnosis not present

## 2020-08-05 DIAGNOSIS — H5210 Myopia, unspecified eye: Secondary | ICD-10-CM | POA: Diagnosis not present

## 2020-08-05 DIAGNOSIS — Z0100 Encounter for examination of eyes and vision without abnormal findings: Secondary | ICD-10-CM | POA: Diagnosis not present

## 2020-08-05 DIAGNOSIS — I10 Essential (primary) hypertension: Secondary | ICD-10-CM | POA: Diagnosis not present

## 2020-08-10 DIAGNOSIS — K5130 Ulcerative (chronic) rectosigmoiditis without complications: Secondary | ICD-10-CM | POA: Diagnosis not present

## 2020-08-24 DIAGNOSIS — K5130 Ulcerative (chronic) rectosigmoiditis without complications: Secondary | ICD-10-CM | POA: Diagnosis not present

## 2020-09-14 ENCOUNTER — Emergency Department
Admission: EM | Admit: 2020-09-14 | Discharge: 2020-09-14 | Disposition: A | Discharge status: Home / Self Care | Payer: Medicare HMO | Source: Home / Self Care

## 2020-09-14 ENCOUNTER — Other Ambulatory Visit: Payer: Self-pay

## 2020-09-14 DIAGNOSIS — J309 Allergic rhinitis, unspecified: Secondary | ICD-10-CM | POA: Diagnosis not present

## 2020-09-14 DIAGNOSIS — B9689 Other specified bacterial agents as the cause of diseases classified elsewhere: Secondary | ICD-10-CM | POA: Diagnosis not present

## 2020-09-14 DIAGNOSIS — J019 Acute sinusitis, unspecified: Secondary | ICD-10-CM

## 2020-09-14 DIAGNOSIS — R059 Cough, unspecified: Secondary | ICD-10-CM | POA: Diagnosis not present

## 2020-09-14 MED ORDER — FEXOFENADINE HCL 180 MG PO TABS: 180.0000 mg | ORAL_TABLET | Freq: Every day | ORAL | 0 refills | Status: DC

## 2020-09-14 MED ORDER — AMOXICILLIN-POT CLAVULANATE 875-125 MG PO TABS: 1.0000 | ORAL_TABLET | Freq: Two times a day (BID) | ORAL | 0 refills | Status: DC

## 2020-09-14 MED ORDER — BENZONATATE 200 MG PO CAPS: 200.0000 mg | ORAL_CAPSULE | Freq: Three times a day (TID) | ORAL | 0 refills | Status: AC | PRN

## 2020-09-14 NOTE — ED Provider Notes (Signed)
Daniel Baldwin [facility]    CSN: [id number] Arrival date & time: 09/14/20  0909      History   Chief Complaint Chief Complaint  Patient presents with  . Sinus issues  . Cough    HPI Daniel Baldwin is a 70 y.o. male.   HPI 70 year old-year-old male with sinus nasal congestion and cough since last Wednesday.  Patient reports negative COVID-19 home test last Thursday.  Denies fever, chills, and body aches.  Past Medical History:  Diagnosis Date  . Hyperlipidemia 01/21/2014  . Hypertension   . UC (ulcerative colitis) [facility])     Patient Active Problem List   Diagnosis Date Noted  . Impingement syndrome, shoulder, left 12/22/2019  . Malignant melanoma ([place]) 12/03/2019  . Actinic keratosis 07/23/2019  . Elevated PSA 10/07/2018  . Ulcerative proctitis with rectal bleeding ([place]) 07/25/2018  . Thrombosed external hemorrhoid 07/25/2018  . Former smoker 10/10/2017  . Elevated fasting glucose 01/03/2017  . Diarrhea 03/29/2016  . Blood in stool 03/29/2016  . Essential hypertension 01/21/2014  . Ulnar neuropathy of right upper extremity 01/21/2014  . Hyperlipidemia 01/21/2014    History reviewed. No pertinent surgical history.     Home Medications    Prior to Admission medications   Medication Sig Start Date End Date Taking? Authorizing Provider  amoxicillin-clavulanate (AUGMENTIN) 875-125 MG tablet Take 1 tablet by mouth every 12 (twelve) hours. 09/14/20  Yes Eliezer Lofts, FNP  benzonatate (TESSALON) 200 MG capsule Take 1 capsule (200 mg total) by mouth 3 (three) times daily as needed for up to 7 days for cough. 09/14/20 09/21/20 Yes Eliezer Lofts, FNP  fexofenadine [facility] ALLERGY) 180 MG tablet Take 1 tablet (180 mg total) by mouth daily for 15 days. 09/14/20 09/29/20 Yes Eliezer Lofts, FNP  atorvastatin (LIPITOR) 40 MG tablet Take 1 tablet (40 mg total) by mouth daily. appt for refills 07/28/20   Iran Planas L, PA-C  hydrochlorothiazide (HYDRODIURIL) 12.5 MG tablet Take 1  tablet (12.5 mg total) by mouth daily. appt for refills 07/28/20   Breeback, Jade L, PA-C  inFLIXimab (REMICADE) 100 MG injection Inject into the vein. Every 8 weeks (currently in loading dose)    [provider]  losartan (COZAAR) 50 MG tablet Take 1 tablet (50 mg total) by mouth daily. 07/02/20 07/02/21  Donella Stade, PA-C  mesalamine (APRISO) 0.375 g 24 hr capsule  05/25/20   [provider]  Omega-3 Fatty Acids (FISH OIL) 1000 MG CAPS Take 1,000 mg by mouth daily.    [provider]    Family History Family History  Problem Relation Age of Onset  . Hypertension Mother   . Hypertension Father     Social History Social History   Tobacco Use  . Smoking status: Former Smoker    Quit date: 08/21/2001    Years since quitting: 19.0  . Smokeless tobacco: Never Used  Vaping Use  . Vaping Use: Never used  Substance Use Topics  . Alcohol use: Yes  . Drug use: No     Allergies   Ace inhibitors and Pollen extract   Review of Systems Review of Systems  Constitutional: Negative.   HENT: Positive for congestion, postnasal drip, sinus pressure and sinus pain.   Eyes: Negative.   Respiratory: Positive for cough.   Cardiovascular: Negative.   Gastrointestinal: Negative.   Genitourinary: Negative.   Musculoskeletal: Negative.   Skin: Negative.   Neurological: Negative.      Physical Exam Triage Vital Signs ED Triage Vitals [09/14/20  0924]  Enc Vitals Group     BP (!) 152/95     Pulse Rate 92     Resp 17     Temp 98.2 F (36.8 C)     Temp Source Oral     SpO2 98 %     Weight      Height      Head Circumference      Peak Flow      Pain Score 0     Pain Loc      Pain Edu?      Excl. in [place]?    No data found.  Updated Vital Signs BP (!) 152/95 (BP Location: Right Arm)   Pulse 92   Temp 98.2 F (36.8 C) (Oral)   Resp 17   SpO2 98%       Physical Exam Vitals and nursing note reviewed.  Constitutional:      General: He is not in  acute distress.    Appearance: Normal appearance. He is normal weight. He is not ill-appearing.  HENT:     Head: Normocephalic and atraumatic.     Right Ear: Tympanic membrane and ear canal normal.     Left Ear: Tympanic membrane and ear canal normal.     Nose: Congestion present. No rhinorrhea.     Comments: Turbinates are erythematous    Mouth/Throat:     Mouth: Mucous membranes are moist.     Pharynx: Oropharynx is clear.     Comments: Moderate amount of clear drainage of posterior oropharynx noted Eyes:     Extraocular Movements: Extraocular movements intact.     Conjunctiva/sclera: Conjunctivae normal.     Pupils: Pupils are equal, round, and reactive to light.  Cardiovascular:     Rate and Rhythm: Normal rate and regular rhythm.     Pulses: Normal pulses.     Heart sounds: Normal heart sounds.  Pulmonary:     Effort: Pulmonary effort is normal. No respiratory distress.     Breath sounds: Normal breath sounds. No wheezing, rhonchi or rales.  Musculoskeletal:        General: Normal range of motion.     Cervical back: Normal range of motion and neck supple. No tenderness.  Lymphadenopathy:     Cervical: Cervical adenopathy present.  Skin:    General: Skin is warm and dry.  Neurological:     General: No focal deficit present.     Mental Status: He is alert and oriented to person, place, and time.  Psychiatric:        Mood and Affect: Mood normal.        Behavior: Behavior normal.        Thought Content: Thought content normal.      UC Treatments / Results  Labs (all labs ordered are listed, but only abnormal results are displayed) Labs Reviewed - No data to display  EKG   Radiology No results found.  Procedures Procedures (including critical care time)  Medications Ordered in UC Medications - No data to display  Initial Impression / Assessment and Plan / UC Course  I have reviewed the triage vital signs and the nursing notes.  Pertinent labs & imaging  results that were available during my care of the patient were reviewed by me and considered in my medical decision making (see chart for details).     MDM: 1.  Acute bacterial rhinosinusitis, 2.  Cough, 3.  Allergic rhinitis.  Patient discharged home, hemodynamically stable. Final  Clinical Impressions(s) / UC Diagnoses   Final diagnoses:  Acute bacterial rhinosinusitis  Cough  Allergic rhinitis, unspecified seasonality, unspecified trigger     Discharge Instructions     Advised/instructed patient to take medication as directed with food to completion.  Advised patient take Allegra 180 mg daily for the next 5 days, then as needed. Advised patient may use Tessalon Perles daily, as needed for cough.  Encourage patient increase daily water intake while taking these medications.    ED Prescriptions    Medication Sig Dispense Auth. Provider   amoxicillin-clavulanate (AUGMENTIN) 875-125 MG tablet Take 1 tablet by mouth every 12 (twelve) hours. 14 tablet Eliezer Lofts, FNP   fexofenadine [facility] ALLERGY) 180 MG tablet Take 1 tablet (180 mg total) by mouth daily for 15 days. 15 tablet Eliezer Lofts, FNP   benzonatate (TESSALON) 200 MG capsule Take 1 capsule (200 mg total) by mouth 3 (three) times daily as needed for up to 7 days for cough. 30 capsule Eliezer Lofts, FNP     PDMP not reviewed this encounter.   Eliezer Lofts, [place] 09/14/20 1021

## 2020-09-14 NOTE — Discharge Instructions (Signed)
Advised/instructed patient to take medication as directed with food to completion.  Advised patient take Allegra 180 mg daily for the next 5 days, then as needed. Advised patient may use Tessalon Perles daily, as needed for cough.  Encourage patient increase daily water intake while taking these medications.

## 2020-09-14 NOTE — ED Triage Notes (Addendum)
Pt c/o sinus congestion and cough since last Wed. Started with sore throat which as somewhat resolved. Robitussin and nyquil prn. Hx of seasonal allergies. At home covid test neg last Thurs. Denies fever/chills/bodyaches.

## 2020-09-29 ENCOUNTER — Ambulatory Visit (INDEPENDENT_AMBULATORY_CARE_PROVIDER_SITE_OTHER): Payer: Medicare HMO | Admitting: Physician Assistant

## 2020-09-29 ENCOUNTER — Encounter: Payer: Self-pay | Admitting: Physician Assistant

## 2020-09-29 ENCOUNTER — Other Ambulatory Visit: Payer: Self-pay

## 2020-09-29 VITALS — BP 139/76 | HR 87 | Ht 70.0 in | Wt 185.0 lb

## 2020-09-29 DIAGNOSIS — Z79899 Other long term (current) drug therapy: Secondary | ICD-10-CM

## 2020-09-29 DIAGNOSIS — Z0000 Encounter for general adult medical examination without abnormal findings: Secondary | ICD-10-CM | POA: Diagnosis not present

## 2020-09-29 DIAGNOSIS — R972 Elevated prostate specific antigen [PSA]: Secondary | ICD-10-CM | POA: Diagnosis not present

## 2020-09-29 DIAGNOSIS — Z1329 Encounter for screening for other suspected endocrine disorder: Secondary | ICD-10-CM

## 2020-09-29 DIAGNOSIS — E782 Mixed hyperlipidemia: Secondary | ICD-10-CM | POA: Diagnosis not present

## 2020-09-29 DIAGNOSIS — I10 Essential (primary) hypertension: Secondary | ICD-10-CM

## 2020-09-29 DIAGNOSIS — L570 Actinic keratosis: Secondary | ICD-10-CM

## 2020-09-29 DIAGNOSIS — Z85820 Personal history of malignant melanoma of skin: Secondary | ICD-10-CM

## 2020-09-29 MED ORDER — HYDROCHLOROTHIAZIDE 12.5 MG PO TABS: 12.5000 mg | ORAL_TABLET | Freq: Every day | ORAL | 3 refills | Status: DC

## 2020-09-29 MED ORDER — LOSARTAN POTASSIUM 25 MG PO TABS: 25.0000 mg | ORAL_TABLET | Freq: Every day | ORAL | 1 refills | Status: DC

## 2020-09-29 MED ORDER — ATORVASTATIN CALCIUM 40 MG PO TABS: 40.0000 mg | ORAL_TABLET | Freq: Every day | ORAL | 3 refills | Status: DC

## 2020-09-29 NOTE — Progress Notes (Signed)
Subjective:    Patient ID: Daniel Baldwin, male    DOB: 1950-07-18, 70 y.o.   MRN: [id number]  HPI Pt is a 70 yo male with HTN, HLD, hx of melanoma, ulcerative colitis who presents to the clinic for medication follow up and preventative care.   No problems or concerns today.   His dermatologist left the practice and would like to get someone in [place]. Hx of melanoma.    .. Active Ambulatory Problems    Diagnosis Date Noted   Essential hypertension 01/21/2014   Ulnar neuropathy of right upper extremity 01/21/2014   Hyperlipidemia 01/21/2014   Diarrhea 03/29/2016   Blood in stool 03/29/2016   Elevated fasting glucose 01/03/2017   Former smoker 10/10/2017   Ulcerative proctitis with rectal bleeding (Daniel Baldwin) 07/25/2018   Thrombosed external hemorrhoid 07/25/2018   Elevated PSA 10/07/2018   Actinic keratosis 07/23/2019   Malignant melanoma (Daniel Baldwin) 12/03/2019   Impingement syndrome, shoulder, left 12/22/2019   Resolved Ambulatory Problems    Diagnosis Date Noted   No Resolved Ambulatory Problems   Past Medical History:  Diagnosis Date   Hypertension    UC (ulcerative colitis) (Daniel Baldwin)    .[name] Family History  Problem Relation Age of Onset   Hypertension Mother    Hypertension Father    .[name] Social History   Socioeconomic History   Marital status: Married    Spouse name: Not on file   Number of children: Not on file   Years of education: Not on file   Highest education level: Not on file  Occupational History   Not on file  Tobacco Use   Smoking status: Former    Pack years: 0.00    Types: Cigarettes    Quit date: 08/21/2001    Years since quitting: 19.1   Smokeless tobacco: Never  Vaping Use   Vaping Use: Never used  Substance and Sexual Activity   Alcohol use: Yes   Drug use: No   Sexual activity: Not on file  Other Topics Concern   Not on file  Social History Narrative   Not on file   Social Determinants of Health   Financial Resource Strain: Not on file   Food Insecurity: Not on file  Transportation Needs: Not on file  Physical Activity: Not on file  Stress: Not on file  Social Connections: Not on file  Intimate Partner Violence: Not on file    Review of Systems  All other systems reviewed and are negative.     Objective:   Physical Exam Vitals reviewed.   BP 139/76   Pulse 87   Ht 5\' 10"  (1.778 m)   Wt 185 lb (83.9 kg)   SpO2 96%   BMI 26.54 kg/m   General Appearance:    Alert, cooperative, no distress, appears stated age  Head:    Normocephalic, without obvious abnormality, atraumatic  Eyes:    PERRL, conjunctiva/corneas clear, EOM's intact, fundi    benign, both eyes       Ears:    Normal TM's and external ear canals, both ears  Nose:   Nares normal, septum midline, mucosa normal, no drainage    or sinus tenderness  Throat:   Lips, mucosa, and tongue normal; teeth and gums normal  Neck:   Supple, symmetrical, trachea midline, no adenopathy;       thyroid:  No enlargement/tenderness/nodules; no carotid   bruit or JVD  Back:     Symmetric, no curvature, ROM normal, no CVA tenderness  Lungs:     Clear to auscultation bilaterally, respirations unlabored  Chest wall:    No tenderness or deformity  Heart:    Regular rate and rhythm, S1 and S2 normal, no murmur, rub   or gallop  Abdomen:     Soft, non-tender, bowel sounds active all four quadrants,    no masses, no organomegaly        Extremities:   Extremities normal, atraumatic, no cyanosis or edema  Pulses:   2+ and symmetric all extremities  Skin:   Skin color, texture, turgor normal, no rashes or lesions  Lymph nodes:   Cervical, supraclavicular, and axillary nodes normal  Neurologic:   CNII-XII intact. Normal strength, sensation and reflexes      throughout      .[name]  [place] Name 09/29/20 1400         During the last Month   Sensation of Bladder not Empty Not at all     Urinate<2 hours after last Not at all     Mult. stop/start when  voiding Not at all     Difficult to postpone voiding Not at all     Weak urinary stream Not at all     Push/strain to begin urination Not at all     Times per night up to urinate Less than 1 time in 5           OTHER     Total Score 1            .. Depression screen [facility] 2/9 09/29/2020 07/22/2019 10/02/2018 10/08/2017 11/24/2016  Decreased Interest 0 0 0 0 0  Down, Depressed, Hopeless 0 0 0 0 0  PHQ - 2 Score 0 0 0 0 0  Altered sleeping 0 0 0 0 -  Tired, decreased energy 0 0 0 0 -  Change in appetite 0 0 0 0 -  Feeling bad or failure about yourself  0 0 0 0 -  Trouble concentrating 0 0 0 0 -  Moving slowly or fidgety/restless 0 0 0 0 -  Suicidal thoughts 0 0 0 0 -  PHQ-9 Score 0 0 0 0 -  Difficult doing work/chores Not difficult at all Not difficult at all Not difficult at all - -       Assessment & Plan:  Marland KitchenMarland KitchenLeonce was seen today for follow-up.  Diagnoses and all orders for this visit:  Preventative health care  Essential hypertension -     hydrochlorothiazide (HYDRODIURIL) 12.5 MG tablet; Take 1 tablet (12.5 mg total) by mouth daily. -     COMPLETE METABOLIC PANEL WITH GFR -     losartan (COZAAR) 25 MG tablet; Take 1 tablet (25 mg total) by mouth daily.  Mixed hyperlipidemia -     atorvastatin (LIPITOR) 40 MG tablet; Take 1 tablet (40 mg total) by mouth daily. -     Lipid Panel w/reflex Direct LDL  Medication management -     hydrochlorothiazide (HYDRODIURIL) 12.5 MG tablet; Take 1 tablet (12.5 mg total) by mouth daily. -     atorvastatin (LIPITOR) 40 MG tablet; Take 1 tablet (40 mg total) by mouth daily. -     CBC with Differential/Platelet -     COMPLETE METABOLIC PANEL WITH GFR -     Lipid Panel w/reflex Direct LDL -     PSA -     TSH  Elevated PSA -     PSA  Thyroid disorder  screen -     TSH  Actinic keratosis -     Ambulatory referral to Dermatology  History of melanoma -     Ambulatory referral to Dermatology  .[name] Discussed 150 minutes of exercise a  week.  Encouraged vitamin D 1000 units and Calcium 1300mg  or 4 servings of dairy a day.  Fasting labs ordered.  PHQ WNL.  BP to goal. Refilled medications.  AUA low at 1.  PSA ordered.  Covid vaccines UTD.  Need for shingrix. Discussed to get at pharmacy.     Hx of melanoma and previous provider left. Needs another dermatologist. No concerns today.

## 2020-10-01 ENCOUNTER — Other Ambulatory Visit: Payer: Self-pay | Admitting: Physician Assistant

## 2020-10-04 ENCOUNTER — Encounter: Payer: Self-pay | Admitting: Physician Assistant

## 2020-10-04 IMAGING — DX DG SHOULDER 2+V*L*
3 series · 3 of 3 positions shown · non-contrast
Comparison: None.

CLINICAL DATA: Acute left shoulder pain.

EXAM:
LEFT SHOULDER - 2+ VIEW

[shoulder grashey]
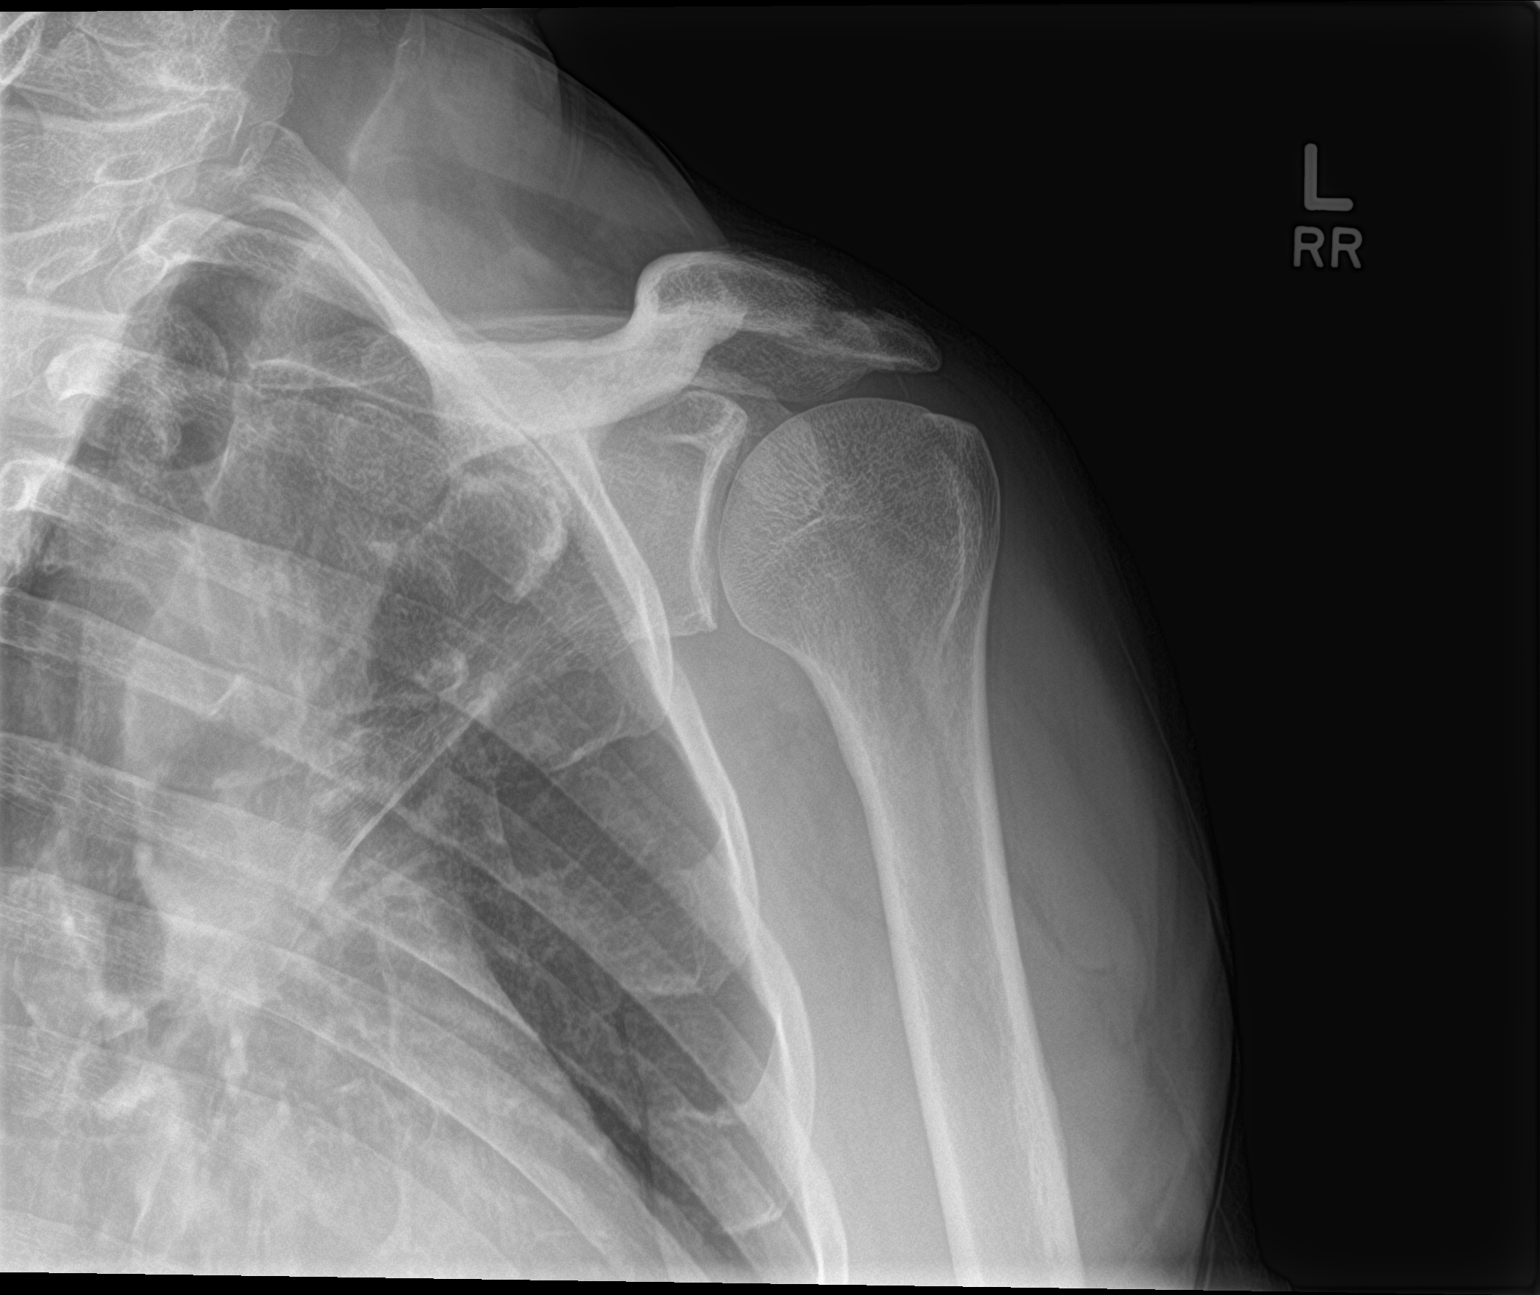

[shoulder y view]
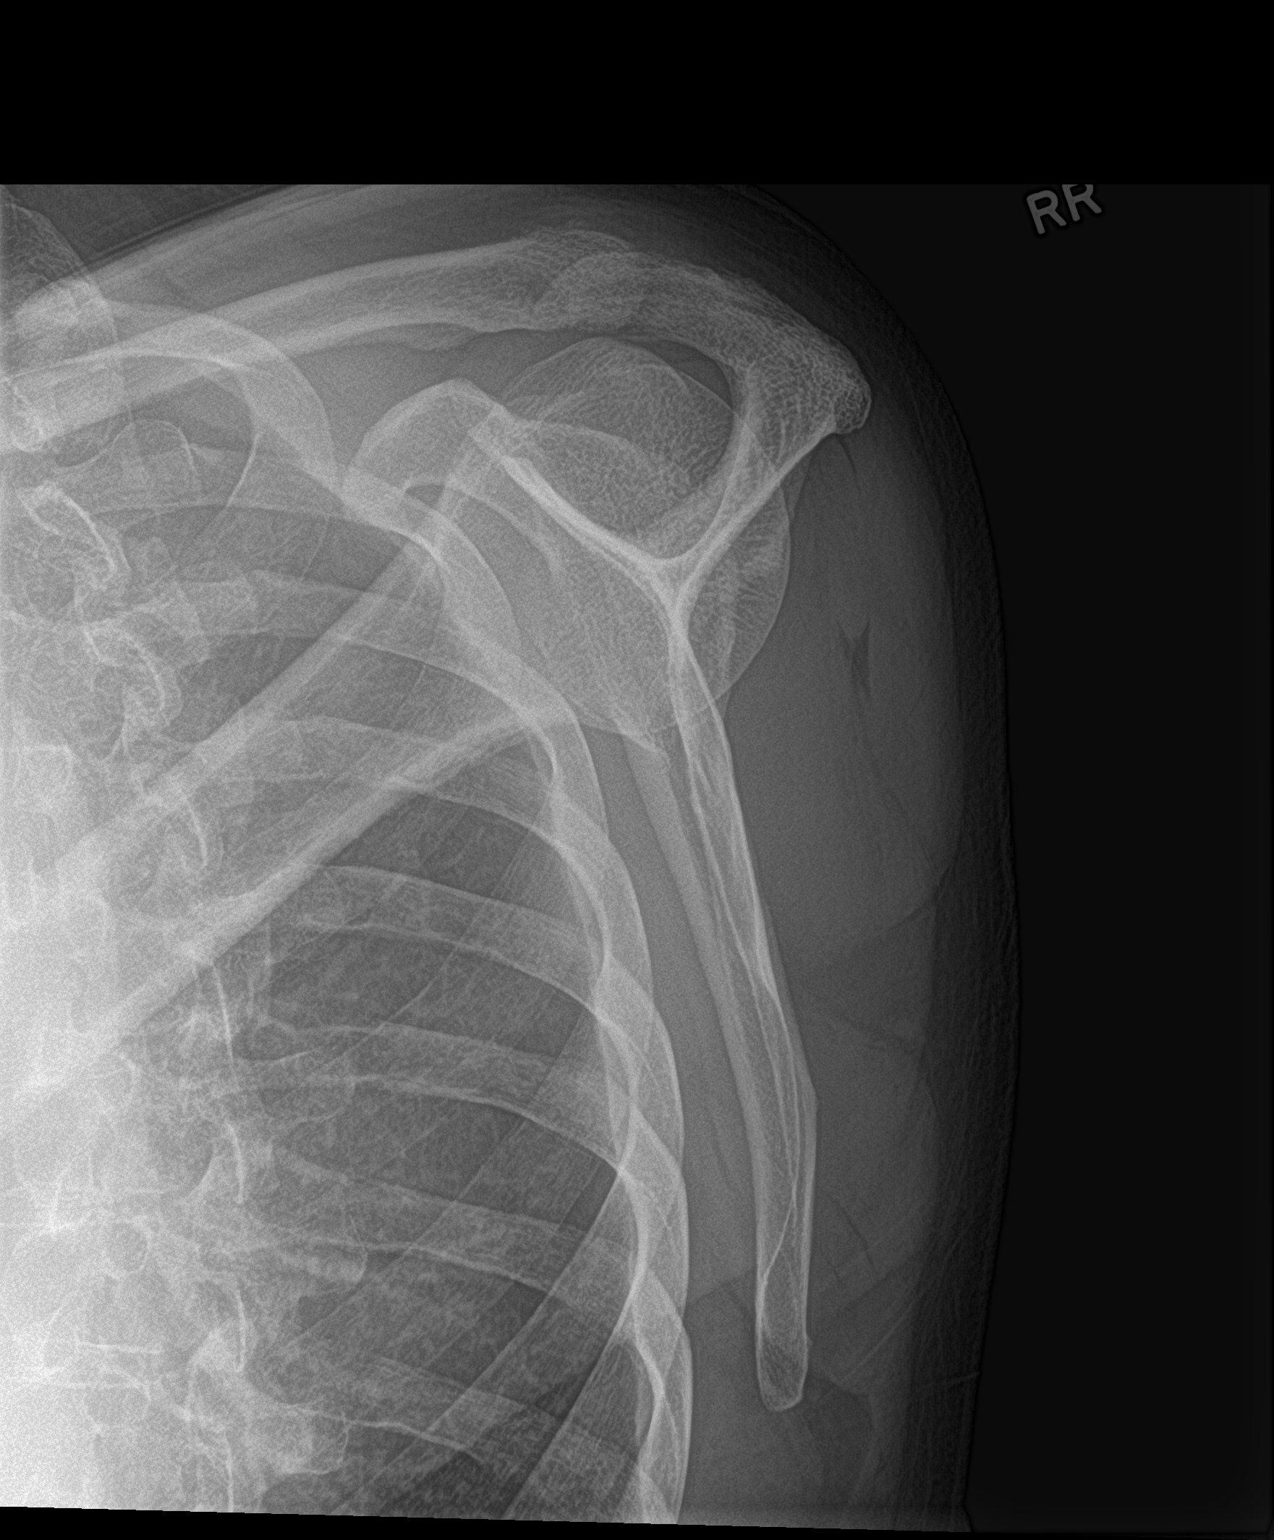

[shoulder axillary]
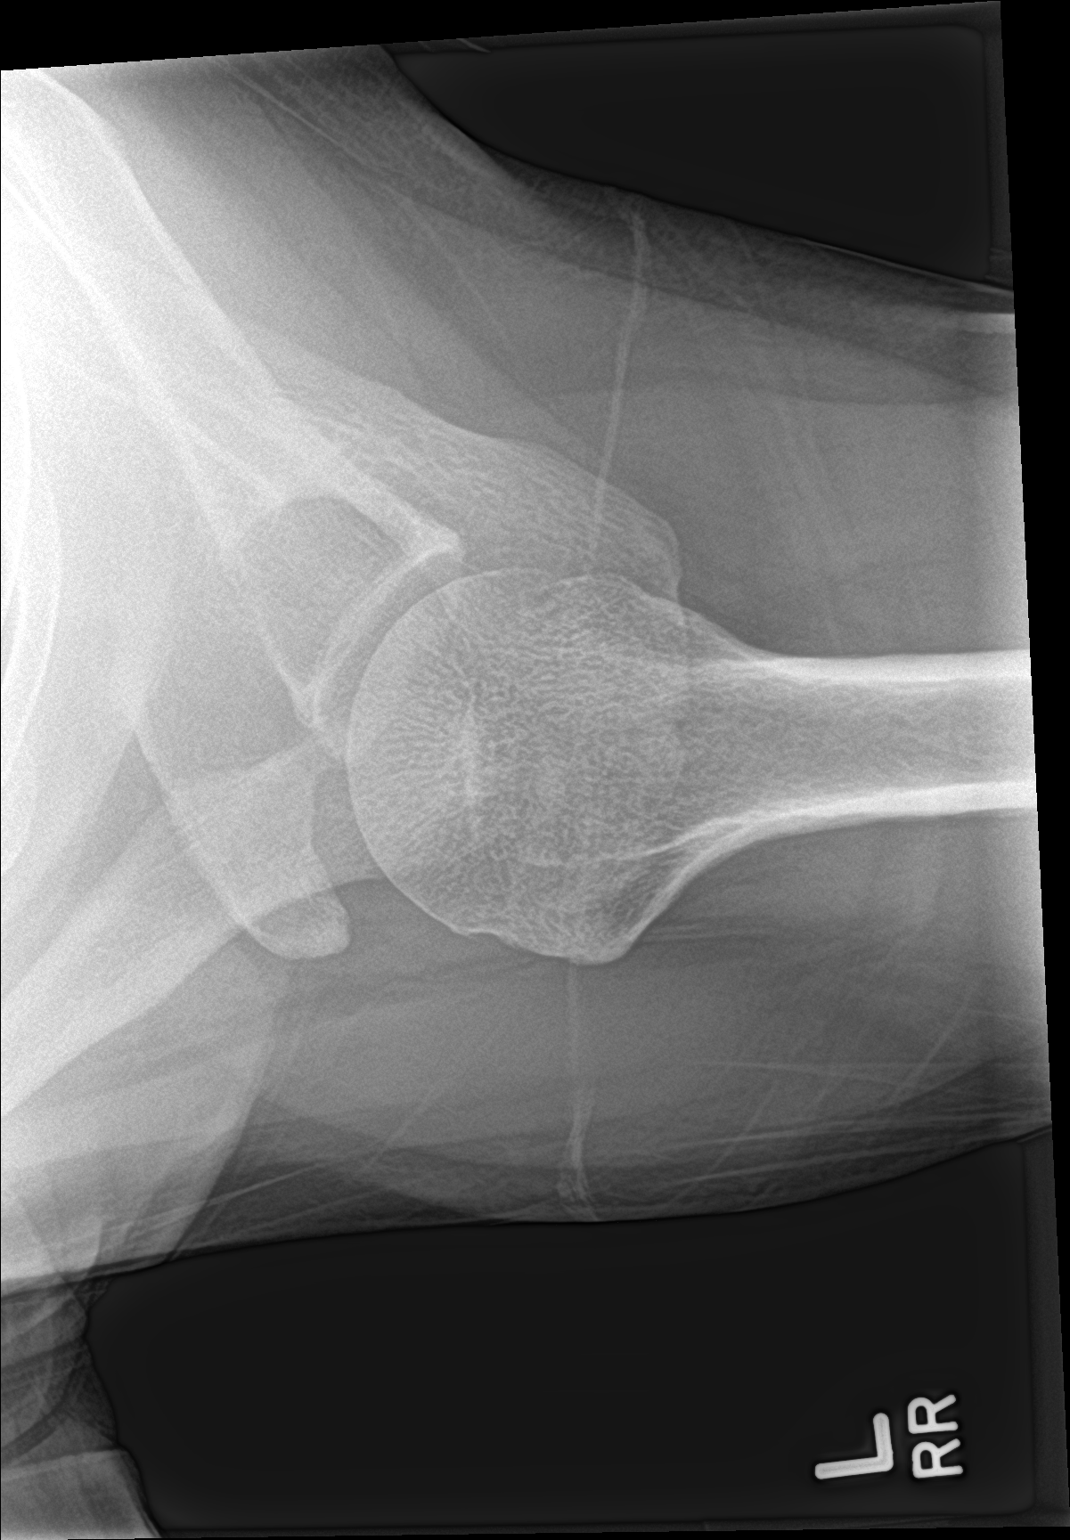

[3 of 3 positions shown; findings below may reference images not displayed]

FINDINGS: There is no evidence of fracture or dislocation. There is no
evidence of arthropathy or other focal bone abnormality. Soft
tissues are unremarkable.
IMPRESSION: Negative.

## 2020-10-04 NOTE — Patient Instructions (Signed)

## 2020-10-12 DIAGNOSIS — Z79899 Other long term (current) drug therapy: Secondary | ICD-10-CM | POA: Diagnosis not present

## 2020-10-12 DIAGNOSIS — R972 Elevated prostate specific antigen [PSA]: Secondary | ICD-10-CM | POA: Diagnosis not present

## 2020-10-12 DIAGNOSIS — Z1329 Encounter for screening for other suspected endocrine disorder: Secondary | ICD-10-CM | POA: Diagnosis not present

## 2020-10-12 DIAGNOSIS — I10 Essential (primary) hypertension: Secondary | ICD-10-CM | POA: Diagnosis not present

## 2020-10-12 DIAGNOSIS — E782 Mixed hyperlipidemia: Secondary | ICD-10-CM | POA: Diagnosis not present

## 2020-10-13 ENCOUNTER — Other Ambulatory Visit: Payer: Self-pay | Admitting: Neurology

## 2020-10-13 ENCOUNTER — Other Ambulatory Visit: Payer: Self-pay | Admitting: Physician Assistant

## 2020-10-13 DIAGNOSIS — Z85820 Personal history of malignant melanoma of skin: Secondary | ICD-10-CM | POA: Diagnosis not present

## 2020-10-13 DIAGNOSIS — D485 Neoplasm of uncertain behavior of skin: Secondary | ICD-10-CM | POA: Diagnosis not present

## 2020-10-13 DIAGNOSIS — L570 Actinic keratosis: Secondary | ICD-10-CM | POA: Diagnosis not present

## 2020-10-13 DIAGNOSIS — W908XXS Exposure to other nonionizing radiation, sequela: Secondary | ICD-10-CM | POA: Diagnosis not present

## 2020-10-13 DIAGNOSIS — R7301 Impaired fasting glucose: Secondary | ICD-10-CM

## 2020-10-13 DIAGNOSIS — R748 Abnormal levels of other serum enzymes: Secondary | ICD-10-CM

## 2020-10-13 DIAGNOSIS — L820 Inflamed seborrheic keratosis: Secondary | ICD-10-CM | POA: Diagnosis not present

## 2020-10-13 DIAGNOSIS — L578 Other skin changes due to chronic exposure to nonionizing radiation: Secondary | ICD-10-CM | POA: Diagnosis not present

## 2020-10-13 DIAGNOSIS — L821 Other seborrheic keratosis: Secondary | ICD-10-CM | POA: Diagnosis not present

## 2020-10-13 LAB — COMPLETE METABOLIC PANEL WITH GFR
AG Ratio: 1.4 (calc) (ref 1.0–2.5)
ALT: 77 U/L — ABNORMAL HIGH (ref 9–46)
AST: 52 U/L — ABNORMAL HIGH (ref 10–35)
Albumin: 4.3 g/dL (ref 3.6–5.1)
Alkaline phosphatase (APISO): 62 U/L (ref 35–144)
BUN: 12 mg/dL (ref 7–25)
CO2: 26 mmol/L (ref 20–32)
Calcium: 9.4 mg/dL (ref 8.6–10.3)
Chloride: 104 mmol/L (ref 98–110)
Creat: 0.85 mg/dL (ref 0.70–1.25)
GFR, Est African American: 104 mL/min/{1.73_m2} (ref >=60)
GFR, Est Non African American: 90 mL/min/{1.73_m2} (ref >=60)
Globulin: 3 g/dL (calc) (ref 1.9–3.7)
Glucose, Bld: 101 mg/dL — ABNORMAL HIGH (ref 65–99)
Potassium: 3.8 mmol/L (ref 3.5–5.3)
Sodium: 140 mmol/L (ref 135–146)
Total Bilirubin: 0.7 mg/dL (ref 0.2–1.2)
Total Protein: 7.3 g/dL (ref 6.1–8.1)

## 2020-10-13 LAB — LIPID PANEL W/REFLEX DIRECT LDL
Cholesterol: 145 mg/dL (ref <200)
HDL: 44 mg/dL (ref >=40)
LDL Cholesterol (Calc): 77 mg/dL (calc)
Non-HDL Cholesterol (Calc): 101 mg/dL (calc) (ref <130)
Total CHOL/HDL Ratio: 3.3 (calc) (ref <5.0)
Triglycerides: 147 mg/dL (ref <150)

## 2020-10-13 LAB — CBC WITH DIFFERENTIAL/PLATELET
Absolute Monocytes: 1022 cells/uL — ABNORMAL HIGH (ref 200–950)
Basophils Absolute: 57 cells/uL (ref 0–200)
Basophils Relative: 0.8 %
Eosinophils Absolute: 241 cells/uL (ref 15–500)
Eosinophils Relative: 3.4 %
HCT: 46.6 % (ref 38.5–50.0)
Hemoglobin: 15.6 g/dL (ref 13.2–17.1)
Lymphs Abs: 1605 cells/uL (ref 850–3900)
MCH: 29.9 pg (ref 27.0–33.0)
MCHC: 33.5 g/dL (ref 32.0–36.0)
MCV: 89.4 fL (ref 80.0–100.0)
MPV: 11.2 fL (ref 7.5–12.5)
Monocytes Relative: 14.4 %
Neutro Abs: 4175 cells/uL (ref 1500–7800)
Neutrophils Relative %: 58.8 %
Platelets: 195 10*3/uL (ref 140–400)
RBC: 5.21 10*6/uL (ref 4.20–5.80)
RDW: 12.4 % (ref 11.0–15.0)
Total Lymphocyte: 22.6 %
WBC: 7.1 10*3/uL (ref 3.8–10.8)

## 2020-10-13 LAB — TSH: TSH: 0.71 mIU/L (ref 0.40–4.50)

## 2020-10-13 LAB — PSA: PSA: 1.19 ng/mL (ref <=4.00)

## 2020-10-13 NOTE — Progress Notes (Signed)
Daniel Baldwin,   Prostate enzyme stable and in normal range.  Thyroid normal.  Cholesterol fabulous.  Monocytes are elevated and liver enzymes elevated. Looks like you have recently been fighting a virus of some sort. We do need to make sure liver enzymes return to normal. Avoid any alcohol and limit tylenol and recheck in 4 weeks liver enzymes.  Fasting glucose just a tad elevated. We need to add A1C to further evaluate.

## 2020-10-14 ENCOUNTER — Encounter: Payer: Self-pay | Admitting: Physician Assistant

## 2020-10-19 DIAGNOSIS — K5190 Ulcerative colitis, unspecified, without complications: Secondary | ICD-10-CM | POA: Diagnosis not present

## 2020-12-14 DIAGNOSIS — K5190 Ulcerative colitis, unspecified, without complications: Secondary | ICD-10-CM | POA: Diagnosis not present

## 2020-12-16 ENCOUNTER — Telehealth: Payer: Self-pay | Admitting: Lab

## 2020-12-16 DIAGNOSIS — K5130 Ulcerative (chronic) rectosigmoiditis without complications: Secondary | ICD-10-CM | POA: Diagnosis not present

## 2020-12-16 NOTE — Progress Notes (Signed)
  Chronic Care Management   Note  12/16/2020 Name: Daniel Baldwin MRN: [id number] DOB: Sep 21, 1950  Daniel Baldwin is a 70 y.o. year old male who is a primary care patient of Donella Stade, PA-C. I reached out to Hewitt Blade by phone today in response to a referral sent by Mr. Daniel Baldwin's PCP, Donella Stade, PA-C.   Daniel Baldwin was given information about Chronic Care Management services today including:  CCM service includes personalized support from designated clinical staff supervised by his physician, including individualized plan of care and coordination with other care providers 24/7 contact phone numbers for assistance for urgent and routine care needs. Service will only be billed when office clinical staff spend 20 minutes or more in a month to coordinate care. Only one practitioner may furnish and bill the service in a calendar month. The patient may stop CCM services at any time (effective at the end of the month) by phone call to the office staff.   Patient did not agree to enrollment in care management services and does not wish to consider at this time.  Follow up plan: [place]

## 2020-12-16 NOTE — Progress Notes (Signed)
  Chronic Care Management   Outreach Note  12/16/2020 Name: Daniel Baldwin MRN: [id number] DOB: 04-20-1950  Referred by: Donella Stade, PA-C Reason for referral : Medication Management   An unsuccessful telephone outreach was attempted today. The patient was referred to the pharmacist for assistance with care management and care coordination.   Follow Up Plan:   [place]

## 2020-12-17 DIAGNOSIS — Z125 Encounter for screening for malignant neoplasm of prostate: Secondary | ICD-10-CM | POA: Diagnosis not present

## 2020-12-17 DIAGNOSIS — K5130 Ulcerative (chronic) rectosigmoiditis without complications: Secondary | ICD-10-CM | POA: Diagnosis not present

## 2021-02-07 ENCOUNTER — Encounter: Payer: Self-pay | Admitting: Physician Assistant

## 2021-02-07 DIAGNOSIS — I10 Essential (primary) hypertension: Secondary | ICD-10-CM

## 2021-02-07 MED ORDER — LOSARTAN POTASSIUM 25 MG PO TABS: 25.0000 mg | ORAL_TABLET | Freq: Every day | ORAL | 1 refills | Status: DC

## 2021-02-08 DIAGNOSIS — K51918 Ulcerative colitis, unspecified with other complication: Secondary | ICD-10-CM | POA: Diagnosis not present

## 2021-02-10 DIAGNOSIS — K5130 Ulcerative (chronic) rectosigmoiditis without complications: Secondary | ICD-10-CM | POA: Diagnosis not present

## 2021-02-10 DIAGNOSIS — K51918 Ulcerative colitis, unspecified with other complication: Secondary | ICD-10-CM | POA: Diagnosis not present

## 2021-02-14 DIAGNOSIS — K5130 Ulcerative (chronic) rectosigmoiditis without complications: Secondary | ICD-10-CM | POA: Diagnosis not present

## 2021-02-14 DIAGNOSIS — K51918 Ulcerative colitis, unspecified with other complication: Secondary | ICD-10-CM | POA: Diagnosis not present

## 2021-03-19 DIAGNOSIS — R509 Fever, unspecified: Secondary | ICD-10-CM | POA: Diagnosis not present

## 2021-03-19 DIAGNOSIS — J029 Acute pharyngitis, unspecified: Secondary | ICD-10-CM | POA: Diagnosis not present

## 2021-03-19 DIAGNOSIS — R519 Headache, unspecified: Secondary | ICD-10-CM | POA: Diagnosis not present

## 2021-03-19 DIAGNOSIS — J0100 Acute maxillary sinusitis, unspecified: Secondary | ICD-10-CM | POA: Diagnosis not present

## 2021-03-19 DIAGNOSIS — J3489 Other specified disorders of nose and nasal sinuses: Secondary | ICD-10-CM | POA: Diagnosis not present

## 2021-03-19 DIAGNOSIS — R059 Cough, unspecified: Secondary | ICD-10-CM | POA: Diagnosis not present

## 2021-03-19 DIAGNOSIS — R0981 Nasal congestion: Secondary | ICD-10-CM | POA: Diagnosis not present

## 2021-03-28 DIAGNOSIS — K5130 Ulcerative (chronic) rectosigmoiditis without complications: Secondary | ICD-10-CM | POA: Diagnosis not present

## 2021-04-15 DIAGNOSIS — J019 Acute sinusitis, unspecified: Secondary | ICD-10-CM | POA: Diagnosis not present

## 2021-04-15 DIAGNOSIS — R519 Headache, unspecified: Secondary | ICD-10-CM | POA: Diagnosis not present

## 2021-04-15 DIAGNOSIS — E7800 Pure hypercholesterolemia, unspecified: Secondary | ICD-10-CM | POA: Diagnosis not present

## 2021-04-15 DIAGNOSIS — Z87891 Personal history of nicotine dependence: Secondary | ICD-10-CM | POA: Diagnosis not present

## 2021-04-15 DIAGNOSIS — K5190 Ulcerative colitis, unspecified, without complications: Secondary | ICD-10-CM | POA: Diagnosis not present

## 2021-04-15 DIAGNOSIS — I10 Essential (primary) hypertension: Secondary | ICD-10-CM | POA: Diagnosis not present

## 2021-04-15 DIAGNOSIS — R059 Cough, unspecified: Secondary | ICD-10-CM | POA: Diagnosis not present

## 2021-04-19 DIAGNOSIS — K5130 Ulcerative (chronic) rectosigmoiditis without complications: Secondary | ICD-10-CM | POA: Diagnosis not present

## 2021-04-19 DIAGNOSIS — R195 Other fecal abnormalities: Secondary | ICD-10-CM | POA: Diagnosis not present

## 2021-04-19 DIAGNOSIS — D84821 Immunodeficiency due to drugs: Secondary | ICD-10-CM | POA: Diagnosis not present

## 2021-04-19 DIAGNOSIS — R198 Other specified symptoms and signs involving the digestive system and abdomen: Secondary | ICD-10-CM | POA: Diagnosis not present

## 2021-04-19 DIAGNOSIS — R768 Other specified abnormal immunological findings in serum: Secondary | ICD-10-CM | POA: Diagnosis not present

## 2021-04-20 ENCOUNTER — Encounter: Payer: Self-pay | Admitting: Physician Assistant

## 2021-04-20 DIAGNOSIS — D84821 Immunodeficiency due to drugs: Secondary | ICD-10-CM | POA: Diagnosis not present

## 2021-04-20 DIAGNOSIS — K5130 Ulcerative (chronic) rectosigmoiditis without complications: Secondary | ICD-10-CM | POA: Diagnosis not present

## 2021-06-11 ENCOUNTER — Encounter: Payer: Self-pay | Admitting: Physician Assistant

## 2021-06-13 MED ORDER — LOSARTAN POTASSIUM 50 MG PO TABS: 50.0000 mg | ORAL_TABLET | Freq: Every day | ORAL | 0 refills | Status: DC

## 2021-09-12 ENCOUNTER — Other Ambulatory Visit: Payer: Self-pay | Admitting: Physician Assistant

## 2021-09-14 DIAGNOSIS — Z135 Encounter for screening for eye and ear disorders: Secondary | ICD-10-CM | POA: Diagnosis not present

## 2021-09-14 DIAGNOSIS — H2513 Age-related nuclear cataract, bilateral: Secondary | ICD-10-CM | POA: Diagnosis not present

## 2021-09-14 DIAGNOSIS — H5213 Myopia, bilateral: Secondary | ICD-10-CM | POA: Diagnosis not present

## 2021-09-19 ENCOUNTER — Encounter: Payer: Self-pay | Admitting: Physician Assistant

## 2021-09-19 ENCOUNTER — Ambulatory Visit (INDEPENDENT_AMBULATORY_CARE_PROVIDER_SITE_OTHER): Payer: Medicare HMO | Admitting: Physician Assistant

## 2021-09-19 VITALS — BP 158/82 | HR 91 | Ht 70.0 in | Wt 190.0 lb

## 2021-09-19 DIAGNOSIS — R972 Elevated prostate specific antigen [PSA]: Secondary | ICD-10-CM

## 2021-09-19 DIAGNOSIS — Z79899 Other long term (current) drug therapy: Secondary | ICD-10-CM | POA: Diagnosis not present

## 2021-09-19 DIAGNOSIS — Z1329 Encounter for screening for other suspected endocrine disorder: Secondary | ICD-10-CM

## 2021-09-19 DIAGNOSIS — I10 Essential (primary) hypertension: Secondary | ICD-10-CM

## 2021-09-19 DIAGNOSIS — Z0000 Encounter for general adult medical examination without abnormal findings: Secondary | ICD-10-CM | POA: Diagnosis not present

## 2021-09-19 DIAGNOSIS — R7301 Impaired fasting glucose: Secondary | ICD-10-CM

## 2021-09-19 DIAGNOSIS — E782 Mixed hyperlipidemia: Secondary | ICD-10-CM

## 2021-09-19 MED ORDER — LOSARTAN POTASSIUM 50 MG PO TABS: 50.0000 mg | ORAL_TABLET | Freq: Every day | ORAL | 3 refills | Status: DC

## 2021-09-19 MED ORDER — ATORVASTATIN CALCIUM 40 MG PO TABS: 40.0000 mg | ORAL_TABLET | Freq: Every day | ORAL | 3 refills | Status: DC

## 2021-09-19 MED ORDER — HYDROCHLOROTHIAZIDE 12.5 MG PO TABS: 12.5000 mg | ORAL_TABLET | Freq: Every day | ORAL | 3 refills | Status: DC

## 2021-09-19 NOTE — Patient Instructions (Signed)
Health Maintenance After Age 69 After age 69, you are at a higher risk for certain long-term diseases and infections as well as injuries from falls. Falls are a major cause of broken bones and head injuries in people who are older than age 69. Getting regular preventive care can help to keep you healthy and well. Preventive care includes getting regular testing and making lifestyle changes as recommended by your health care provider. Talk with your health care provider about: Which screenings and tests you should have. A screening is a test that checks for a disease when you have no symptoms. A diet and exercise plan that is right for you. What should I know about screenings and tests to prevent falls? Screening and testing are the best ways to find a health problem early. Early diagnosis and treatment give you the best chance of managing medical conditions that are common after age 69. Certain conditions and lifestyle choices may make you more likely to have a fall. Your health care provider may recommend: Regular vision checks. Poor vision and conditions such as cataracts can make you more likely to have a fall. If you wear glasses, make sure to get your prescription updated if your vision changes. Medicine review. Work with your health care provider to regularly review all of the medicines you are taking, including over-the-counter medicines. Ask your health care provider about any side effects that may make you more likely to have a fall. Tell your health care provider if any medicines that you take make you feel dizzy or sleepy. Strength and balance checks. Your health care provider may recommend certain tests to check your strength and balance while standing, walking, or changing positions. Foot health exam. Foot pain and numbness, as well as not wearing proper footwear, can make you more likely to have a fall. Screenings, including: Osteoporosis screening. Osteoporosis is a condition that causes  the bones to get weaker and break more easily. Blood pressure screening. Blood pressure changes and medicines to control blood pressure can make you feel dizzy. Depression screening. You may be more likely to have a fall if you have a fear of falling, feel depressed, or feel unable to do activities that you used to do. Alcohol use screening. Using too much alcohol can affect your balance and may make you more likely to have a fall. Follow these instructions at home: Lifestyle Do not drink alcohol if: Your health care provider tells you not to drink. If you drink alcohol: Limit how much you have to: 0-1 drink a day for women. 0-2 drinks a day for men. Know how much alcohol is in your drink. In the [place] one drink equals one 12 oz bottle of beer (355 mL), one 5 oz glass of wine (148 mL), or one 1 oz glass of hard liquor (44 mL). Do not use any products that contain nicotine or tobacco. These products include cigarettes, chewing tobacco, and vaping devices, such as e-cigarettes. If you need help quitting, ask your health care provider. Activity  Follow a regular exercise program to stay fit. This will help you maintain your balance. Ask your health care provider what types of exercise are appropriate for you. If you need a cane or walker, use it as recommended by your health care provider. Wear supportive shoes that have nonskid soles. Safety  Remove any tripping hazards, such as rugs, cords, and clutter. Install safety equipment such as grab bars in bathrooms and safety rails on stairs. Keep rooms and walkways   well-lit. General instructions Talk with your health care provider about your risks for falling. Tell your health care provider if: You fall. Be sure to tell your health care provider about all falls, even ones that seem minor. You feel dizzy, tiredness (fatigue), or off-balance. Take over-the-counter and prescription medicines only as told by your health care provider. These include  supplements. Eat a healthy diet and maintain a healthy weight. A healthy diet includes low-fat dairy products, low-fat (lean) meats, and fiber from whole grains, beans, and lots of fruits and vegetables. Stay current with your vaccines. Schedule regular health, dental, and eye exams. Summary Having a healthy lifestyle and getting preventive care can help to protect your health and wellness after age 69. Screening and testing are the best way to find a health problem early and help you avoid having a fall. Early diagnosis and treatment give you the best chance for managing medical conditions that are more common for people who are older than age 69. Falls are a major cause of broken bones and head injuries in people who are older than age 69. Take precautions to prevent a fall at home. Work with your health care provider to learn what changes you can make to improve your health and wellness and to prevent falls. This information is not intended to replace advice given to you by your health care provider. Make sure you discuss any questions you have with your health care provider. Document Revised: 08/16/2020 Document Reviewed: 08/16/2020 Elsevier Patient Education  2023 [employer] Inc[employer]  

## 2021-09-19 NOTE — Progress Notes (Signed)
Complete physical exam  Patient: Daniel Baldwin   DOB: 1951-03-20   71 y.o. Male  MRN: [id number]  Subjective:    Chief Complaint  Patient presents with   Annual Exam    Daniel Baldwin is a 71 y.o. male who presents today for a complete physical exam. He reports consuming a general diet.  Pt is daily active and tries to walk regularly  He generally feels well. He reports sleeping well. He does not have additional problems to discuss today.    Most recent fall risk assessment:    09/19/2021    1:40 PM  Fall Risk   Falls in the past year? 0  Number falls in past yr: 0  Injury with Fall? 0  Risk for fall due to : No Fall Risks  Follow up Falls evaluation completed     Most recent depression screenings:    09/19/2021    1:40 PM 09/29/2020    2:03 PM  PHQ 2/9 Scores  PHQ - 2 Score 0 0  PHQ- 9 Score  0    Vision:Within last year, Dental: No current dental problems, and PSA: Agrees to PSA testing  Patient Active Problem List   Diagnosis Date Noted   Elevated liver enzymes 10/13/2020   Impingement syndrome, shoulder, left 12/22/2019   Malignant melanoma ([place]) 12/03/2019   Actinic keratosis 07/23/2019   Elevated PSA 10/07/2018   Ulcerative proctitis with rectal bleeding ([place]) 07/25/2018   Thrombosed external hemorrhoid 07/25/2018   Former smoker 10/10/2017   Elevated fasting glucose 01/03/2017   Diarrhea 03/29/2016   Blood in stool 03/29/2016   Essential hypertension 01/21/2014   Ulnar neuropathy of right upper extremity 01/21/2014   Hyperlipidemia 01/21/2014   Past Medical History:  Diagnosis Date   Hyperlipidemia 01/21/2014   Hypertension    UC (ulcerative colitis) ([place])    Family History  Problem Relation Age of Onset   Hypertension Mother    Hypertension Father    Allergies  Allergen Reactions   Ace Inhibitors    Pollen Extract       Patient Care Team: Lavada Mesi as PCP - General (Family Medicine)   Outpatient Medications Prior to Visit   Medication Sig   Omega-3 Fatty Acids (FISH OIL) 1000 MG CAPS Take 1,000 mg by mouth daily.   [DISCONTINUED] atorvastatin (LIPITOR) 40 MG tablet Take 1 tablet (40 mg total) by mouth daily.   [DISCONTINUED] hydrochlorothiazide (HYDRODIURIL) 12.5 MG tablet Take 1 tablet (12.5 mg total) by mouth daily.   [DISCONTINUED] losartan (COZAAR) 50 MG tablet Take 1 tablet (50 mg total) by mouth daily. NO REFILLS. NEEDS AN APPT. DUE FOR ANNUAL CHECK & LABS.   [DISCONTINUED] inFLIXimab (REMICADE) 100 MG injection Inject into the vein. Every 8 weeks (currently in loading dose)   No facility-administered medications prior to visit.    Review of Systems  All other systems reviewed and are negative.         Objective:     BP (!) 158/82   Pulse 91   Ht '5\' 10"'$  (1.778 m)   Wt 190 lb (86.2 kg)   SpO2 98%   BMI 27.26 kg/m  BP Readings from Last 3 Encounters:  09/19/21 (!) 158/82  09/29/20 139/76  09/14/20 (!) 152/95   Wt Readings from Last 3 Encounters:  09/19/21 190 lb (86.2 kg)  09/29/20 185 lb (83.9 kg)  05/25/20 185 lb (83.9 kg)      Physical Exam  BP (!) 158/82  Pulse 91   Ht '5\' 10"'$  (1.778 m)   Wt 190 lb (86.2 kg)   SpO2 98%   BMI 27.26 kg/m   General Appearance:    Alert, cooperative, no distress, appears stated age  Head:    Normocephalic, without obvious abnormality, atraumatic  Eyes:    PERRL, conjunctiva/corneas clear, EOM's intact, fundi    benign, both eyes       Ears:    Normal TM's and external ear canals, both ears  Nose:   Nares normal, septum midline, mucosa normal, no drainage    or sinus tenderness  Throat:   Lips, mucosa, and tongue normal; teeth and gums normal  Neck:   Supple, symmetrical, trachea midline, no adenopathy;       thyroid:  No enlargement/tenderness/nodules; no carotid   bruit or JVD  Back:     Symmetric, no curvature, ROM normal, no CVA tenderness  Lungs:     Clear to auscultation bilaterally, respirations unlabored  Chest wall:    No  tenderness or deformity  Heart:    Regular rate and rhythm, S1 and S2 normal, no murmur, rub   or gallop  Abdomen:     Soft, non-tender, bowel sounds active all four quadrants,    no masses, no organomegaly        Extremities:   Extremities normal, atraumatic, no cyanosis or edema  Pulses:   2+ and symmetric all extremities  Skin:   Skin color, texture, turgor normal, no rashes or lesions  Lymph nodes:   Cervical, supraclavicular, and axillary nodes normal  Neurologic:   CNII-XII intact. Normal strength, sensation and reflexes      throughout       Assessment & Plan:    Routine Health Maintenance and Physical Exam  Immunization History  Administered Date(s) Administered   Fluad Quad(high Dose 65+) 01/22/2019   Influenza, High Dose Seasonal PF 01/22/2018   Influenza,inj,Quad PF,6+ Mos 01/20/2014, 05/26/2016, 01/02/2017   Influenza-Unspecified 01/27/2020   PFIZER Comirnaty(Gray Top)Covid-19 Tri-Sucrose Vaccine 07/16/2020   PFIZER(Purple Top)SARS-COV-2 Vaccination 06/06/2019, 07/04/2019, 01/08/2020   PPD Test 08/05/2019   Pfizer Covid-19 Vaccine Bivalent Booster 43yr & up 11/08/2020   Pneumococcal Conjugate-13 10/08/2017   Pneumococcal Polysaccharide-23 10/02/2018   Tdap 03/12/2014   Zoster Recombinat (Shingrix) 12/05/2018   Zoster, Live 03/12/2014    Health Maintenance  Topic Date Due   Zoster Vaccines- Shingrix (2 of 2) 12/20/2021 (Originally 01/30/2019)   INFLUENZA VACCINE  11/08/2021   TETANUS/TDAP  03/12/2024   COLONOSCOPY (Pts 45-448yrInsurance coverage will need to be confirmed)  05/12/2026   Pneumonia Vaccine 6583Years old  Completed   COVID-19 Vaccine  Completed   Hepatitis C Screening  Completed   HPV VACCINES  Aged Out    Discussed health benefits of physical activity, and encouraged him to engage in regular exercise appropriate for his age and condition.  ..Marland KitchenDominica Severinas seen today for annual exam.  Diagnoses and all orders for this visit:  Preventative  health care -     PSA -     TSH -     Lipid Panel w/reflex Direct LDL -     COMPLETE METABOLIC PANEL WITH GFR -     CBC with Differential/Platelet -     Hemoglobin A1c  Essential hypertension -     COMPLETE METABOLIC PANEL WITH GFR -     hydrochlorothiazide (HYDRODIURIL) 12.5 MG tablet; Take 1 tablet (12.5 mg total) by mouth daily. -     losartan (COZAAR)  50 MG tablet; Take 1 tablet (50 mg total) by mouth daily.  Mixed hyperlipidemia -     Lipid Panel w/reflex Direct LDL -     atorvastatin (LIPITOR) 40 MG tablet; Take 1 tablet (40 mg total) by mouth daily.  Elevated PSA -     PSA  Thyroid disorder screen -     TSH  Elevated fasting glucose -     Hemoglobin A1c  Medication management -     atorvastatin (LIPITOR) 40 MG tablet; Take 1 tablet (40 mg total) by mouth daily. -     hydrochlorothiazide (HYDRODIURIL) 12.5 MG tablet; Take 1 tablet (12.5 mg total) by mouth daily.   [name].Start a regular exercise program and make sure you are eating a healthy diet Try to eat 4 servings of dairy a day or take a calcium supplement ('500mg'$  twice a day). Colonoscopy UTD Vaccines UTD BP elevated today. Higher on 2nd recheck.  Pt checking had home and getting under 140/90. He will send home readings.  Continue on cozaar/HCTZ Fasting labs ordered and will refill lipitor accordinly   Iran Planas, PA-C

## 2021-09-22 DIAGNOSIS — I10 Essential (primary) hypertension: Secondary | ICD-10-CM | POA: Diagnosis not present

## 2021-09-22 DIAGNOSIS — Z0000 Encounter for general adult medical examination without abnormal findings: Secondary | ICD-10-CM | POA: Diagnosis not present

## 2021-09-22 DIAGNOSIS — Z1329 Encounter for screening for other suspected endocrine disorder: Secondary | ICD-10-CM | POA: Diagnosis not present

## 2021-09-22 DIAGNOSIS — R972 Elevated prostate specific antigen [PSA]: Secondary | ICD-10-CM | POA: Diagnosis not present

## 2021-09-22 DIAGNOSIS — R7301 Impaired fasting glucose: Secondary | ICD-10-CM | POA: Diagnosis not present

## 2021-09-22 DIAGNOSIS — E782 Mixed hyperlipidemia: Secondary | ICD-10-CM | POA: Diagnosis not present

## 2021-09-23 LAB — CBC WITH DIFFERENTIAL/PLATELET
Absolute Monocytes: 980 cells/uL — ABNORMAL HIGH (ref 200–950)
Basophils Absolute: 81 cells/uL (ref 0–200)
Basophils Relative: 1.3 %
Eosinophils Absolute: 397 cells/uL (ref 15–500)
Eosinophils Relative: 6.4 %
HCT: 46.6 % (ref 38.5–50.0)
Hemoglobin: 15.8 g/dL (ref 13.2–17.1)
Lymphs Abs: 1426 cells/uL (ref 850–3900)
MCH: 31.6 pg (ref 27.0–33.0)
MCHC: 33.9 g/dL (ref 32.0–36.0)
MCV: 93.2 fL (ref 80.0–100.0)
MPV: 11.1 fL (ref 7.5–12.5)
Monocytes Relative: 15.8 %
Neutro Abs: 3317 cells/uL (ref 1500–7800)
Neutrophils Relative %: 53.5 %
Platelets: 227 10*3/uL (ref 140–400)
RBC: 5 10*6/uL (ref 4.20–5.80)
RDW: 12.4 % (ref 11.0–15.0)
Total Lymphocyte: 23 %
WBC: 6.2 10*3/uL (ref 3.8–10.8)

## 2021-09-23 LAB — PSA: PSA: 1.16 ng/mL (ref <=4.00)

## 2021-09-23 LAB — HEMOGLOBIN A1C
Hgb A1c MFr Bld: 5.6 % of total Hgb (ref <5.7)
Mean Plasma Glucose: 114 mg/dL
eAG (mmol/L): 6.3 mmol/L

## 2021-09-23 LAB — COMPLETE METABOLIC PANEL WITH GFR
AG Ratio: 1.8 (calc) (ref 1.0–2.5)
ALT: 52 U/L — ABNORMAL HIGH (ref 9–46)
AST: 32 U/L (ref 10–35)
Albumin: 4.5 g/dL (ref 3.6–5.1)
Alkaline phosphatase (APISO): 57 U/L (ref 35–144)
BUN: 18 mg/dL (ref 7–25)
CO2: 28 mmol/L (ref 20–32)
Calcium: 9.7 mg/dL (ref 8.6–10.3)
Chloride: 103 mmol/L (ref 98–110)
Creat: 1.04 mg/dL (ref 0.70–1.35)
Globulin: 2.5 g/dL (calc) (ref 1.9–3.7)
Glucose, Bld: 126 mg/dL — ABNORMAL HIGH (ref 65–99)
Potassium: 4.3 mmol/L (ref 3.5–5.3)
Sodium: 141 mmol/L (ref 135–146)
Total Bilirubin: 0.9 mg/dL (ref 0.2–1.2)
Total Protein: 7 g/dL (ref 6.1–8.1)
eGFR: 78 mL/min/{1.73_m2} (ref >=60)

## 2021-09-23 LAB — TSH: TSH: 1.12 mIU/L (ref 0.40–4.50)

## 2021-09-23 LAB — LIPID PANEL W/REFLEX DIRECT LDL
Cholesterol: 149 mg/dL (ref <200)
HDL: 51 mg/dL (ref >=40)
LDL Cholesterol (Calc): 72 mg/dL (calc)
Non-HDL Cholesterol (Calc): 98 mg/dL (calc) (ref <130)
Total CHOL/HDL Ratio: 2.9 (calc) (ref <5.0)
Triglycerides: 185 mg/dL — ABNORMAL HIGH (ref <150)

## 2021-09-23 NOTE — Progress Notes (Signed)
Daniel Baldwin,   A1C is stable at 5.6. just on the edge of pre-diabetes.  Thyroid looks great.  PSA stable and low.  Hemoglobin and WBC look great.  Liver enzymes improved.  Kidney function good.  LDL and HDL to goal.  TG a little elevated. Make sure watching fried, processed, sugar filled foods.

## 2021-10-13 ENCOUNTER — Other Ambulatory Visit: Payer: Self-pay | Admitting: Physician Assistant

## 2021-10-13 DIAGNOSIS — Z79899 Other long term (current) drug therapy: Secondary | ICD-10-CM

## 2021-10-13 DIAGNOSIS — E782 Mixed hyperlipidemia: Secondary | ICD-10-CM

## 2021-10-17 DIAGNOSIS — L814 Other melanin hyperpigmentation: Secondary | ICD-10-CM | POA: Diagnosis not present

## 2021-10-17 DIAGNOSIS — L578 Other skin changes due to chronic exposure to nonionizing radiation: Secondary | ICD-10-CM | POA: Diagnosis not present

## 2021-10-17 DIAGNOSIS — L570 Actinic keratosis: Secondary | ICD-10-CM | POA: Diagnosis not present

## 2021-10-17 DIAGNOSIS — X32XXXS Exposure to sunlight, sequela: Secondary | ICD-10-CM | POA: Diagnosis not present

## 2021-10-17 DIAGNOSIS — W908XXS Exposure to other nonionizing radiation, sequela: Secondary | ICD-10-CM | POA: Diagnosis not present

## 2021-10-17 DIAGNOSIS — L821 Other seborrheic keratosis: Secondary | ICD-10-CM | POA: Diagnosis not present

## 2021-10-17 DIAGNOSIS — Z85820 Personal history of malignant melanoma of skin: Secondary | ICD-10-CM | POA: Diagnosis not present

## 2021-10-19 ENCOUNTER — Encounter: Payer: Self-pay | Admitting: Physician Assistant

## 2021-11-30 ENCOUNTER — Encounter: Payer: Self-pay | Admitting: General Practice

## 2021-12-07 DIAGNOSIS — K5130 Ulcerative (chronic) rectosigmoiditis without complications: Secondary | ICD-10-CM | POA: Diagnosis not present

## 2021-12-15 DIAGNOSIS — K5130 Ulcerative (chronic) rectosigmoiditis without complications: Secondary | ICD-10-CM | POA: Diagnosis not present

## 2021-12-19 DIAGNOSIS — R7989 Other specified abnormal findings of blood chemistry: Secondary | ICD-10-CM | POA: Diagnosis not present

## 2022-01-02 DIAGNOSIS — R7989 Other specified abnormal findings of blood chemistry: Secondary | ICD-10-CM | POA: Diagnosis not present

## 2022-01-02 DIAGNOSIS — K824 Cholesterolosis of gallbladder: Secondary | ICD-10-CM | POA: Diagnosis not present

## 2022-01-02 DIAGNOSIS — K760 Fatty (change of) liver, not elsewhere classified: Secondary | ICD-10-CM | POA: Diagnosis not present

## 2022-06-22 ENCOUNTER — Telehealth: Payer: Self-pay | Admitting: Physician Assistant

## 2022-06-22 NOTE — Telephone Encounter (Signed)
Called patient to schedule Medicare Annual Wellness Visit (AWV). Left message for patient to call back and schedule Medicare Annual Wellness Visit (AWV).  Last date of AWV: Never  Please schedule an appointment at any time with Nurse Health Advisor.  If any questions, please contact me at [phone].  Thank you ,  Morgan Jessup Patient Access Advocate II Direct Dial: [phone]   

## 2022-09-07 ENCOUNTER — Other Ambulatory Visit: Payer: Self-pay | Admitting: Physician Assistant

## 2022-09-07 DIAGNOSIS — Z79899 Other long term (current) drug therapy: Secondary | ICD-10-CM

## 2022-09-07 DIAGNOSIS — I10 Essential (primary) hypertension: Secondary | ICD-10-CM

## 2022-09-25 ENCOUNTER — Encounter: Payer: Medicare HMO | Admitting: Physician Assistant

## 2022-09-29 ENCOUNTER — Other Ambulatory Visit: Payer: Self-pay | Admitting: Physician Assistant

## 2022-09-29 DIAGNOSIS — I10 Essential (primary) hypertension: Secondary | ICD-10-CM

## 2022-10-04 ENCOUNTER — Ambulatory Visit (INDEPENDENT_AMBULATORY_CARE_PROVIDER_SITE_OTHER): Payer: Medicare HMO | Admitting: Physician Assistant

## 2022-10-04 ENCOUNTER — Encounter: Payer: Self-pay | Admitting: Physician Assistant

## 2022-10-04 VITALS — BP 143/84 | HR 81 | Ht 70.0 in | Wt 187.0 lb

## 2022-10-04 DIAGNOSIS — R0789 Other chest pain: Secondary | ICD-10-CM

## 2022-10-04 DIAGNOSIS — R7301 Impaired fasting glucose: Secondary | ICD-10-CM | POA: Diagnosis not present

## 2022-10-04 DIAGNOSIS — Z125 Encounter for screening for malignant neoplasm of prostate: Secondary | ICD-10-CM

## 2022-10-04 DIAGNOSIS — Z1329 Encounter for screening for other suspected endocrine disorder: Secondary | ICD-10-CM

## 2022-10-04 DIAGNOSIS — R9431 Abnormal electrocardiogram [ECG] [EKG]: Secondary | ICD-10-CM | POA: Diagnosis not present

## 2022-10-04 DIAGNOSIS — Z0000 Encounter for general adult medical examination without abnormal findings: Secondary | ICD-10-CM | POA: Diagnosis not present

## 2022-10-04 DIAGNOSIS — E782 Mixed hyperlipidemia: Secondary | ICD-10-CM | POA: Diagnosis not present

## 2022-10-04 DIAGNOSIS — I10 Essential (primary) hypertension: Secondary | ICD-10-CM

## 2022-10-04 NOTE — Patient Instructions (Addendum)
Increase losartan to 100mg  2 tablets keep checking BP goal is under 130/80.  Will order stress test.   Health Maintenance After Age 70 After age 56, you are at a higher risk for certain long-term diseases and infections as well as injuries from falls. Falls are a major cause of broken bones and head injuries in people who are older than age 32. Getting regular preventive care can help to keep you healthy and well. Preventive care includes getting regular testing and making lifestyle changes as recommended by your health care provider. Talk with your health care provider about: Which screenings and tests you should have. A screening is a test that checks for a disease when you have no symptoms. A diet and exercise plan that is right for you. What should I know about screenings and tests to prevent falls? Screening and testing are the best ways to find a health problem early. Early diagnosis and treatment give you the best chance of managing medical conditions that are common after age 17. Certain conditions and lifestyle choices may make you more likely to have a fall. Your health care provider may recommend: Regular vision checks. Poor vision and conditions such as cataracts can make you more likely to have a fall. If you wear glasses, make sure to get your prescription updated if your vision changes. Medicine review. Work with your health care provider to regularly review all of the medicines you are taking, including over-the-counter medicines. Ask your health care provider about any side effects that may make you more likely to have a fall. Tell your health care provider if any medicines that you take make you feel dizzy or sleepy. Strength and balance checks. Your health care provider may recommend certain tests to check your strength and balance while standing, walking, or changing positions. Foot health exam. Foot pain and numbness, as well as not wearing proper footwear, can make you more likely  to have a fall. Screenings, including: Osteoporosis screening. Osteoporosis is a condition that causes the bones to get weaker and break more easily. Blood pressure screening. Blood pressure changes and medicines to control blood pressure can make you feel dizzy. Depression screening. You may be more likely to have a fall if you have a fear of falling, feel depressed, or feel unable to do activities that you used to do. Alcohol use screening. Using too much alcohol can affect your balance and may make you more likely to have a fall. Follow these instructions at home: Lifestyle Do not drink alcohol if: Your health care provider tells you not to drink. If you drink alcohol: Limit how much you have to: 0-1 drink a day for women. 0-2 drinks a day for men. Know how much alcohol is in your drink. In the [place] one drink equals one 12 oz bottle of beer (355 mL), one 5 oz glass of wine (148 mL), or one 1 oz glass of hard liquor (44 mL). Do not use any products that contain nicotine or tobacco. These products include cigarettes, chewing tobacco, and vaping devices, such as e-cigarettes. If you need help quitting, ask your health care provider. Activity  Follow a regular exercise program to stay fit. This will help you maintain your balance. Ask your health care provider what types of exercise are appropriate for you. If you need a cane or walker, use it as recommended by your health care provider. Wear supportive shoes that have nonskid soles. Safety  Remove any tripping hazards, such as rugs, cords,  and clutter. Install safety equipment such as grab bars in bathrooms and safety rails on stairs. Keep rooms and walkways well-lit. General instructions Talk with your health care provider about your risks for falling. Tell your health care provider if: You fall. Be sure to tell your health care provider about all falls, even ones that seem minor. You feel dizzy, tiredness (fatigue), or  off-balance. Take over-the-counter and prescription medicines only as told by your health care provider. These include supplements. Eat a healthy diet and maintain a healthy weight. A healthy diet includes low-fat dairy products, low-fat (lean) meats, and fiber from whole grains, beans, and lots of fruits and vegetables. Stay current with your vaccines. Schedule regular health, dental, and eye exams. Summary Having a healthy lifestyle and getting preventive care can help to protect your health and wellness after age 77. Screening and testing are the best way to find a health problem early and help you avoid having a fall. Early diagnosis and treatment give you the best chance for managing medical conditions that are more common for people who are older than age 28. Falls are a major cause of broken bones and head injuries in people who are older than age 45. Take precautions to prevent a fall at home. Work with your health care provider to learn what changes you can make to improve your health and wellness and to prevent falls. This information is not intended to replace advice given to you by your health care provider. Make sure you discuss any questions you have with your health care provider. Document Revised: 08/16/2020 Document Reviewed: 08/16/2020 Elsevier Patient Education  2024 [employer].

## 2022-10-04 NOTE — Progress Notes (Signed)
Complete physical exam  Patient: Daniel Baldwin   DOB: [birth year]   72 y.o. Male  MRN: [id number]  Subjective:    Chief Complaint  Patient presents with   Annual Exam   Hypertension    Wants to discuss HTN medication    Daniel Baldwin is a 72 y.o. male who presents today for a complete physical exam. He reports consuming a general diet.  He does walk fairly regularly  He generally feels well. He reports sleeping well. He does have additional problems to discuss today.   He is having some intermittent sharp chest pains that feel like a "prick" and then they go away. No SOB or edema. No palpitations. Not known to be associated with exertion.    Most recent fall risk assessment:    10/04/2022    9:19 AM  Fall Risk   Number falls in past yr: 0  Injury with Fall? 0  Risk for fall due to : No Fall Risks  Follow up Falls evaluation completed     Most recent depression screenings:    10/04/2022    9:19 AM 09/19/2021    1:40 PM  PHQ 2/9 Scores  PHQ - 2 Score 0 0    Vision:Within last year, Dental: No current dental problems and Receives regular dental care, and PSA: Agrees to PSA testing  Patient Active Problem List   Diagnosis Date Noted   Atypical chest pain 10/04/2022   Abnormal EKG 10/04/2022   Elevated liver enzymes 10/13/2020   Impingement syndrome, shoulder, left 12/22/2019   Malignant melanoma (HCC) 12/03/2019   Actinic keratosis 07/23/2019   Elevated PSA 10/07/2018   Ulcerative proctitis with rectal bleeding (HCC) 07/25/2018   Thrombosed external hemorrhoid 07/25/2018   Former smoker 10/10/2017   Elevated fasting glucose 01/03/2017   Diarrhea 03/29/2016   Blood in stool 03/29/2016   Essential hypertension 01/21/2014   Ulnar neuropathy of right upper extremity 01/21/2014   Hyperlipidemia 01/21/2014   Past Medical History:  Diagnosis Date   Hyperlipidemia 01/21/2014   Hypertension    UC (ulcerative colitis) (HCC)    History reviewed. No pertinent surgical  history. Family History  Problem Relation Age of Onset   Hypertension Mother    Hypertension Father    Allergies  Allergen Reactions   Ace Inhibitors    Pollen Extract       Patient Care Team: Daniel Baldwin as PCP - General (Family Medicine)   Outpatient Medications Prior to Visit  Medication Sig   atorvastatin (LIPITOR) 40 MG tablet Take 1 tablet (40 mg total) by mouth daily.   hydrochlorothiazide (HYDRODIURIL) 12.5 MG tablet TAKE 1 TABLET BY MOUTH EVERY DAY   losartan (COZAAR) 50 MG tablet TAKE 1 TABLET BY MOUTH EVERY DAY   [DISCONTINUED] Omega-3 Fatty Acids (FISH OIL) 1000 MG CAPS Take 1,000 mg by mouth daily.   No facility-administered medications prior to visit.    ROS See HPI.  Objective:     BP (!) 143/84 (BP Location: Right Arm, Patient Position: Sitting, Cuff Size: Normal)   Pulse 81   Ht 5\' 10"  (1.778 m)   Wt 187 lb (84.8 kg)   SpO2 97%   BMI 26.83 kg/m  BP Readings from Last 3 Encounters:  10/04/22 (!) 143/84  09/19/21 (!) 158/82  09/29/20 139/76   Wt Readings from Last 3 Encounters:  10/04/22 187 lb (84.8 kg)  09/19/21 190 lb (86.2 kg)  09/29/20 185 lb (83.9 kg)      Physical  Exam     BP (!) 143/84 (BP Location: Right Arm, Patient Position: Sitting, Cuff Size: Normal)   Pulse 81   Ht 5\' 10"  (1.778 m)   Wt 187 lb (84.8 kg)   SpO2 97%   BMI 26.83 kg/m   General Appearance:    Alert, cooperative, no distress, appears stated age  Head:    Normocephalic, without obvious abnormality, atraumatic  Eyes:    PERRL, conjunctiva/corneas clear, EOM's intact, fundi    benign, both eyes       Ears:    Normal TM's and external ear canals, both ears  Nose:   Nares normal, septum midline, mucosa normal, no drainage    or sinus tenderness  Throat:   Lips, mucosa, and tongue normal; teeth and gums normal  Neck:   Supple, symmetrical, trachea midline, no adenopathy;       thyroid:  No enlargement/tenderness/nodules; no carotid   bruit or JVD   Back:     Symmetric, no curvature, ROM normal, no CVA tenderness  Lungs:     Clear to auscultation bilaterally, respirations unlabored  Chest wall:    No tenderness or deformity  Heart:    Regular rate and rhythm, S1 and S2 normal, no murmur, rub   or gallop  Abdomen:     Soft, non-tender, bowel sounds active all four quadrants,    no masses, no organomegaly        Extremities:   Extremities normal, atraumatic, no cyanosis or edema  Pulses:   2+ and symmetric all extremities  Skin:   Skin color, texture, turgor normal, no rashes or lesions  Lymph nodes:   Cervical, supraclavicular, and axillary nodes normal  Neurologic:   CNII-XII intact. Normal strength, sensation and reflexes      throughout   Assessment & Plan:    Routine Health Maintenance and Physical Exam  Immunization History  Administered Date(s) Administered   COVID-19, mRNA, vaccine(Comirnaty)12 years and older 02/27/2022   Fluad Quad(high Dose 65+) 01/22/2019, 02/27/2022   Influenza, High Dose Seasonal PF 01/22/2018   Influenza,inj,Quad PF,6+ Mos 01/20/2014, 05/26/2016, 01/02/2017   Influenza-Unspecified 01/27/2020   PFIZER Comirnaty(Gray Top)Covid-19 Tri-Sucrose Vaccine 07/16/2020   PFIZER(Purple Top)SARS-COV-2 Vaccination 06/06/2019, 07/04/2019, 01/08/2020   PPD Test 08/05/2019   Pfizer Covid-19 Vaccine Bivalent Booster 65yrs & up 11/08/2020   Pneumococcal Conjugate-13 10/08/2017   Pneumococcal Polysaccharide-23 10/02/2018   Respiratory Syncytial Virus Vaccine,Recomb Aduvanted(Arexvy) 02/27/2022   Tdap 03/12/2014   Zoster Recombinat (Shingrix) 12/05/2018, 04/11/2019   Zoster, Live 03/12/2014    Health Maintenance  Topic Date Due   Medicare Annual Wellness (AWV)  Never done   COVID-19 Vaccine (7 - 2023-24 season) 10/20/2022 (Originally 04/24/2022)   INFLUENZA VACCINE  11/09/2022   DTaP/Tdap/Td (2 - Td or Tdap) 03/12/2024   Colonoscopy  05/14/2028   Pneumonia Vaccine 46+ Years old  Completed   Hepatitis C  Screening  Completed   Zoster Vaccines- Shingrix  Completed   HPV VACCINES  Aged Out    Discussed health benefits of physical activity, and encouraged him to engage in regular exercise appropriate for his age and condition.  Daniel KitchenJillyn Baldwin was seen today for annual exam and hypertension.  Diagnoses and all orders for this visit:  Routine physical examination -     PSA -     TSH -     Lipid Panel w/reflex Direct LDL -     COMPLETE METABOLIC PANEL WITH GFR -     CBC with Differential/Platelet -  Hemoglobin A1c  Prostate cancer screening -     PSA  Thyroid disorder screening -     TSH  Mixed hyperlipidemia -     Lipid Panel w/reflex Direct LDL  Essential hypertension -     COMPLETE METABOLIC PANEL WITH GFR  Elevated fasting glucose -     Hemoglobin A1c  Atypical chest pain -     EXERCISE TOLERANCE TEST (ETT); Future -     EKG 12-Lead  Abnormal EKG -     EXERCISE TOLERANCE TEST (ETT); Future   .Daniel KitchenStart a regular exercise program and make sure you are eating a healthy diet Try to eat 4 servings of dairy a day or take a calcium supplement (500mg  twice a day). PHQ no concerns .Daniel Kitchen  RO-AUA SYMPTOM     Row Name 10/04/22 0900         During the last Month   Sensation of Bladder not Empty Not at all     Urinate<2 hours after last Not at all     Mult. stop/start when voiding Not at all     Difficult to postpone voiding Not at all     Weak urinary stream Not at all     Push/strain to begin urination Not at all     Times per night up to urinate Not at all       OTHER   Total Score 0             No concerns, PSA ordered Fasting labs ordered BP not to goal increased cozaar to 2 tablets 100mg  daily with hydrochlorothiazide 12.5mg  Keep checking BP at home with goal 130/80 EKG changes with atypical chest pains. ST wave in anterior leads appeared like some elevation.  Will order stress test    Return in about 3 months (around 01/04/2023) for BP follow up.     Tandy Gaw, PA-C

## 2022-10-05 LAB — CBC WITH DIFFERENTIAL/PLATELET
Absolute Monocytes: 878 cells/uL (ref 200–950)
Basophils Absolute: 91 cells/uL (ref 0–200)
Basophils Relative: 1.4 %
Eosinophils Absolute: 371 cells/uL (ref 15–500)
Eosinophils Relative: 5.7 %
HCT: 46.1 % (ref 38.5–50.0)
Hemoglobin: 15.5 g/dL (ref 13.2–17.1)
Lymphs Abs: 1248 cells/uL (ref 850–3900)
MCH: 31.1 pg (ref 27.0–33.0)
MCHC: 33.6 g/dL (ref 32.0–36.0)
MCV: 92.6 fL (ref 80.0–100.0)
MPV: 10.9 fL (ref 7.5–12.5)
Monocytes Relative: 13.5 %
Neutro Abs: 3913 cells/uL (ref 1500–7800)
Neutrophils Relative %: 60.2 %
Platelets: 214 10*3/uL (ref 140–400)
RBC: 4.98 10*6/uL (ref 4.20–5.80)
RDW: 12.2 % (ref 11.0–15.0)
Total Lymphocyte: 19.2 %
WBC: 6.5 10*3/uL (ref 3.8–10.8)

## 2022-10-05 LAB — COMPLETE METABOLIC PANEL WITH GFR
AG Ratio: 2 (calc) (ref 1.0–2.5)
ALT: 51 U/L — ABNORMAL HIGH (ref 9–46)
AST: 36 U/L — ABNORMAL HIGH (ref 10–35)
Albumin: 4.7 g/dL (ref 3.6–5.1)
Alkaline phosphatase (APISO): 64 U/L (ref 35–144)
BUN: 15 mg/dL (ref 7–25)
CO2: 29 mmol/L (ref 20–32)
Calcium: 10.1 mg/dL (ref 8.6–10.3)
Chloride: 103 mmol/L (ref 98–110)
Creat: 0.88 mg/dL (ref 0.70–1.28)
Globulin: 2.4 g/dL (calc) (ref 1.9–3.7)
Glucose, Bld: 107 mg/dL — ABNORMAL HIGH (ref 65–99)
Potassium: 4.6 mmol/L (ref 3.5–5.3)
Sodium: 141 mmol/L (ref 135–146)
Total Bilirubin: 0.7 mg/dL (ref 0.2–1.2)
Total Protein: 7.1 g/dL (ref 6.1–8.1)
eGFR: 93 mL/min/{1.73_m2} (ref >=60)

## 2022-10-05 LAB — PSA: PSA: 0.8 ng/mL (ref <=4.00)

## 2022-10-05 LAB — LIPID PANEL W/REFLEX DIRECT LDL
Cholesterol: 147 mg/dL (ref <200)
HDL: 51 mg/dL (ref >=40)
LDL Cholesterol (Calc): 73 mg/dL (calc)
Non-HDL Cholesterol (Calc): 96 mg/dL (calc) (ref <130)
Total CHOL/HDL Ratio: 2.9 (calc) (ref <5.0)
Triglycerides: 145 mg/dL (ref <150)

## 2022-10-05 LAB — HEMOGLOBIN A1C
Hgb A1c MFr Bld: 5.8 % of total Hgb — ABNORMAL HIGH (ref <5.7)
Mean Plasma Glucose: 120 mg/dL
eAG (mmol/L): 6.6 mmol/L

## 2022-10-05 LAB — TSH: TSH: 0.81 mIU/L (ref 0.40–4.50)

## 2022-10-06 ENCOUNTER — Encounter: Payer: Self-pay | Admitting: Physician Assistant

## 2022-10-06 NOTE — Progress Notes (Signed)
Daniel Baldwin,   Kidney function looks good.  Glucose is elevated and hemoglobin A1C is up from one year ago. You are in pre-diabetes range. You can continue to work on diet and exercise and recheck A1C in 6 months.  PSA looks great.  Thyroid looks great.  Cholesterol looks great.  Liver enzymes are staying just a little elevated. How much alcohol if any are you drinking? Are you taking any tylenol products? We need to get an ultrasound of your liver. Are you ok with this?

## 2022-10-07 ENCOUNTER — Encounter: Payer: Self-pay | Admitting: Physician Assistant

## 2022-10-07 DIAGNOSIS — E782 Mixed hyperlipidemia: Secondary | ICD-10-CM

## 2022-10-07 DIAGNOSIS — Z79899 Other long term (current) drug therapy: Secondary | ICD-10-CM

## 2022-10-07 DIAGNOSIS — I10 Essential (primary) hypertension: Secondary | ICD-10-CM

## 2022-10-09 MED ORDER — LOSARTAN POTASSIUM 50 MG PO TABS: 50.0000 mg | ORAL_TABLET | Freq: Every day | ORAL | 3 refills | Status: DC

## 2022-10-10 MED ORDER — ATORVASTATIN CALCIUM 40 MG PO TABS: 40.0000 mg | ORAL_TABLET | Freq: Every day | ORAL | 3 refills | Status: DC

## 2022-10-10 NOTE — Addendum Note (Signed)
Addended by: Chalmers Cater on: 10/10/2022 08:51 AM   Modules accepted: Orders

## 2022-11-28 DIAGNOSIS — H5213 Myopia, bilateral: Secondary | ICD-10-CM | POA: Diagnosis not present

## 2022-11-28 DIAGNOSIS — H2513 Age-related nuclear cataract, bilateral: Secondary | ICD-10-CM | POA: Diagnosis not present

## 2022-11-28 DIAGNOSIS — Z135 Encounter for screening for eye and ear disorders: Secondary | ICD-10-CM | POA: Diagnosis not present

## 2023-04-18 DIAGNOSIS — J208 Acute bronchitis due to other specified organisms: Secondary | ICD-10-CM | POA: Diagnosis not present

## 2023-04-18 DIAGNOSIS — E7800 Pure hypercholesterolemia, unspecified: Secondary | ICD-10-CM | POA: Diagnosis not present

## 2023-04-18 DIAGNOSIS — B9689 Other specified bacterial agents as the cause of diseases classified elsewhere: Secondary | ICD-10-CM | POA: Diagnosis not present

## 2023-04-18 DIAGNOSIS — Z87891 Personal history of nicotine dependence: Secondary | ICD-10-CM | POA: Diagnosis not present

## 2023-04-18 DIAGNOSIS — R6889 Other general symptoms and signs: Secondary | ICD-10-CM | POA: Diagnosis not present

## 2023-04-18 DIAGNOSIS — R059 Cough, unspecified: Secondary | ICD-10-CM | POA: Diagnosis not present

## 2023-04-18 DIAGNOSIS — I10 Essential (primary) hypertension: Secondary | ICD-10-CM | POA: Diagnosis not present

## 2023-04-19 ENCOUNTER — Encounter: Payer: Self-pay | Admitting: Physician Assistant

## 2023-04-19 DIAGNOSIS — I10 Essential (primary) hypertension: Secondary | ICD-10-CM

## 2023-04-19 DIAGNOSIS — Z79899 Other long term (current) drug therapy: Secondary | ICD-10-CM

## 2023-04-19 MED ORDER — HYDROCHLOROTHIAZIDE 12.5 MG PO TABS: 12.5000 mg | ORAL_TABLET | Freq: Every day | ORAL | 1 refills | Status: DC

## 2023-08-06 ENCOUNTER — Ambulatory Visit
Admission: EM | Admit: 2023-08-06 | Discharge: 2023-08-06 | Disposition: A | Discharge status: Home / Self Care | Attending: Family Medicine | Admitting: Family Medicine

## 2023-08-06 DIAGNOSIS — M5441 Lumbago with sciatica, right side: Secondary | ICD-10-CM | POA: Diagnosis not present

## 2023-08-06 DIAGNOSIS — M62830 Muscle spasm of back: Secondary | ICD-10-CM | POA: Diagnosis not present

## 2023-08-06 DIAGNOSIS — S39012A Strain of muscle, fascia and tendon of lower back, initial encounter: Secondary | ICD-10-CM

## 2023-08-06 MED ORDER — METHYLPREDNISOLONE SODIUM SUCC 125 MG IJ SOLR
125.0000 mg | Freq: Once | INTRAMUSCULAR | Status: AC
  Administered 2023-08-06: 125 mg via INTRAMUSCULAR

## 2023-08-06 MED ORDER — METHOCARBAMOL 500 MG PO TABS: 500.0000 mg | ORAL_TABLET | Freq: Three times a day (TID) | ORAL | 0 refills | Status: AC | PRN

## 2023-08-06 MED ORDER — PREDNISONE 10 MG (21) PO TBPK: ORAL_TABLET | Freq: Every day | ORAL | 0 refills | Status: DC

## 2023-08-06 MED ORDER — HYDROCODONE-ACETAMINOPHEN 5-325 MG PO TABS: 1.0000 | ORAL_TABLET | Freq: Three times a day (TID) | ORAL | 0 refills | Status: DC | PRN

## 2023-08-06 NOTE — ED Provider Notes (Signed)
 [facility]    CSN: [id number] Arrival date & time: 08/06/23  0855      History   Chief Complaint Chief Complaint  Patient presents with   Back Pain    HPI Clearance Daniel Baldwin is a 73 y.o. male.   HPI 73 year old male presents with right-sided lower back pain radiating down right leg for 2 weeks.  Patient reports taking OTC Ibuprofen 3 times daily with no relief.  PMH significant for obesity, UC, and HTN.  Past Medical History:  Diagnosis Date   Hyperlipidemia 01/21/2014   Hypertension    UC (ulcerative colitis) [facility])     Patient Active Problem List   Diagnosis Date Noted   Atypical chest pain 10/04/2022   Abnormal EKG 10/04/2022   Elevated liver enzymes 10/13/2020   Impingement syndrome, shoulder, left 12/22/2019   Malignant melanoma (HCC) 12/03/2019   Actinic keratosis 07/23/2019   Elevated PSA 10/07/2018   Ulcerative proctitis with rectal bleeding (HCC) 07/25/2018   Thrombosed external hemorrhoid 07/25/2018   Former smoker 10/10/2017   Elevated fasting glucose 01/03/2017   Diarrhea 03/29/2016   Blood in stool 03/29/2016   Essential hypertension 01/21/2014   Ulnar neuropathy of right upper extremity 01/21/2014   Hyperlipidemia 01/21/2014    History reviewed. No pertinent surgical history.     Home Medications    Prior to Admission medications   Medication Sig Start Date End Date Taking? Authorizing Provider  HYDROcodone-acetaminophen  (NORCO/VICODIN) 5-325 MG tablet Take 1 tablet by mouth every 8 (eight) hours as needed. 08/06/23  Yes Leonides Ramp, FNP  methocarbamol (ROBAXIN) 500 MG tablet Take 1 tablet (500 mg total) by mouth 3 (three) times daily as needed for up to 8 days. 08/06/23 08/14/23 Yes Leonides Ramp, FNP  predniSONE (STERAPRED UNI-PAK 21 TAB) 10 MG (21) TBPK tablet Take by mouth daily. Take 6 tabs by mouth daily  for 2 days, then 5 tabs for 2 days, then 4 tabs for 2 days, then 3 tabs for 2 days, 2 tabs for 2 days, then 1 tab by mouth daily  for 2 days 08/06/23  Yes Leonides Ramp, FNP  atorvastatin  (LIPITOR) 40 MG tablet Take 1 tablet (40 mg total) by mouth daily. 10/10/22   Breeback, Jade L, PA-C  hydrochlorothiazide  (HYDRODIURIL ) 12.5 MG tablet Take 1 tablet (12.5 mg total) by mouth daily. 04/19/23   Breeback, Jade L, PA-C  losartan  (COZAAR ) 50 MG tablet Take 1 tablet (50 mg total) by mouth daily. 10/09/22   Araceli Knight, PA-C    Family History Family History  Problem Relation Age of Onset   Hypertension Mother    Hypertension Father     Social History Social History   Tobacco Use   Smoking status: Former    Current packs/day: 0.00    Types: Cigarettes    Quit date: 08/21/2001    Years since quitting: 21.9   Smokeless tobacco: Never  Vaping Use   Vaping status: Never Used  Substance Use Topics   Alcohol use: Yes    Alcohol/week: 14.0 standard drinks of alcohol    Types: 14 Glasses of wine per week   Drug use: No     Allergies   Ace inhibitors and Pollen extract   Review of Systems Review of Systems  Musculoskeletal:  Positive for back pain.     Physical Exam Triage Vital Signs ED Triage Vitals  Encounter Vitals Group     BP 08/06/23 0914 (!) 172/99     Systolic BP Percentile --  Diastolic BP Percentile --      Pulse Rate 08/06/23 0912 84     Resp 08/06/23 0912 16     Temp 08/06/23 0912 98 F (36.7 C)     Temp src --      SpO2 08/06/23 0912 94 %     Weight --      Height --      Head Circumference --      Peak Flow --      Pain Score 08/06/23 0911 10     Pain Loc --      Pain Education --      Exclude from Growth Chart --    No data found.  Updated Vital Signs BP (!) 172/99 (BP Location: Right Arm)   Pulse 84   Temp 98 F (36.7 C)   Resp 16   SpO2 94%    Physical Exam Vitals reviewed.  Constitutional:      Appearance: Normal appearance. He is normal weight.  HENT:     Head: Normocephalic and atraumatic.     Mouth/Throat:     Mouth: Mucous membranes are moist.      Pharynx: Oropharynx is clear.  Eyes:     Extraocular Movements: Extraocular movements intact.     Conjunctiva/sclera: Conjunctivae normal.     Pupils: Pupils are equal, round, and reactive to light.  Cardiovascular:     Rate and Rhythm: Normal rate and regular rhythm.     Pulses: Normal pulses.     Heart sounds: Normal heart sounds.  Pulmonary:     Effort: Pulmonary effort is normal.     Breath sounds: Normal breath sounds. No wheezing, rhonchi or rales.  Musculoskeletal:        General: Normal range of motion.     Cervical back: Normal range of motion and neck supple.     Comments: Right-sided inferior lumbar spine/right sided inferior spinal erectors: Patient reports severely tender over this area radiating into right hip/right buttocks/right upper leg on exam  Skin:    General: Skin is warm and dry.  Neurological:     General: No focal deficit present.     Mental Status: He is alert and oriented to person, place, and time. Mental status is at baseline.  Psychiatric:        Mood and Affect: Mood normal.        Behavior: Behavior normal.      UC Treatments / Results  Labs (all labs ordered are listed, but only abnormal results are displayed) Labs Reviewed - No data to display  EKG   Radiology No results found.  Procedures Procedures (including critical care time)  Medications Ordered in UC Medications  methylPREDNISolone sodium succinate (SOLU-MEDROL) 125 mg/2 mL injection 125 mg (125 mg Intramuscular Given 08/06/23 0943)    Initial Impression / Assessment and Plan / UC Course  I have reviewed the triage vital signs and the nursing notes.  Pertinent labs & imaging results that were available during my care of the patient were reviewed by me and considered in my medical decision making (see chart for details).     MDM: 1.  Acute right sided low back pain with right-sided sciatica-IM Solu-Medrol 125 mg given once in clinic and prior to discharge, Rx'd Norco 5/325  mg tablet: Take 1 tablet every 8 hours, as needed for severe/acute right back pain; 2.  Strain of lumbar region, initial encounter-Rx'd Sterapred Unipak 21 tab 10 mg; 3.  Muscle spasm of back-Rx'd  Robaxin 500 mg tablet: Take 1 tablet 3 times daily, as needed for accompanying back spasms. Advised patient to take medications as directed with food to completion.  Advised patient may take Norco daily or as needed for acute/severe right sided low back pain.  Patient advised of sedative effects of this medication.  Advised may take Robaxin daily or as needed for accompanying muscle spasms of right side of lower back.  Encouraged to increase daily water intake to 64 ounces per day while taking these medications.  Advised if symptoms worsen and/or unresolved please follow-up with your PCP or here for further evaluation.  Patient discharged home, hemodynamically stable. Final Clinical Impressions(s) / UC Diagnoses   Final diagnoses:  Acute right-sided low back pain with right-sided sciatica  Strain of lumbar region, initial encounter  Muscle spasm of back     Discharge Instructions      Advised patient to take medications as directed with food to completion.  Advised patient may take Norco daily or as needed for acute/severe right sided low back pain.  Patient advised of sedative effects of this medication.  Advised may take Robaxin daily or as needed for accompanying muscle spasms of right side of lower back.  Encouraged to increase daily water intake to 64 ounces per day while taking these medications.  Advised if symptoms worsen and/or unresolved please follow-up with your PCP or here for further evaluation.     ED Prescriptions     Medication Sig Dispense Auth. Provider   predniSONE (STERAPRED UNI-PAK 21 TAB) 10 MG (21) TBPK tablet Take by mouth daily. Take 6 tabs by mouth daily  for 2 days, then 5 tabs for 2 days, then 4 tabs for 2 days, then 3 tabs for 2 days, 2 tabs for 2 days, then 1 tab by mouth  daily for 2 days 42 tablet Leonides Ramp, FNP   HYDROcodone-acetaminophen  (NORCO/VICODIN) 5-325 MG tablet Take 1 tablet by mouth every 8 (eight) hours as needed. 15 tablet Clyde Upshaw, FNP   methocarbamol (ROBAXIN) 500 MG tablet Take 1 tablet (500 mg total) by mouth 3 (three) times daily as needed for up to 8 days. 24 tablet Fani Rotondo, FNP      I have reviewed the PDMP during this encounter.   Leonides Ramp, FNP 08/06/23 1003

## 2023-08-06 NOTE — ED Triage Notes (Signed)
 Pt presents to uc with 2 weeks of lower right back pain radiating down the leg. Pt has been taking Advil

## 2023-08-06 NOTE — Discharge Instructions (Addendum)
 Advised patient to take medications as directed with food to completion.  Advised patient may take Norco daily or as needed for acute/severe right sided low back pain.  Patient advised of sedative effects of this medication.  Advised may take Robaxin daily or as needed for accompanying muscle spasms of right side of lower back.  Encouraged to increase daily water intake to 64 ounces per day while taking these medications.  Advised if symptoms worsen and/or unresolved please follow-up with your PCP or here for further evaluation.

## 2023-08-30 ENCOUNTER — Other Ambulatory Visit: Payer: Self-pay | Admitting: Physician Assistant

## 2023-08-30 DIAGNOSIS — E782 Mixed hyperlipidemia: Secondary | ICD-10-CM

## 2023-08-30 DIAGNOSIS — Z79899 Other long term (current) drug therapy: Secondary | ICD-10-CM

## 2023-08-31 DIAGNOSIS — M9904 Segmental and somatic dysfunction of sacral region: Secondary | ICD-10-CM | POA: Diagnosis not present

## 2023-08-31 DIAGNOSIS — M9905 Segmental and somatic dysfunction of pelvic region: Secondary | ICD-10-CM | POA: Diagnosis not present

## 2023-08-31 DIAGNOSIS — M9902 Segmental and somatic dysfunction of thoracic region: Secondary | ICD-10-CM | POA: Diagnosis not present

## 2023-08-31 DIAGNOSIS — M9903 Segmental and somatic dysfunction of lumbar region: Secondary | ICD-10-CM | POA: Diagnosis not present

## 2023-08-31 DIAGNOSIS — M5441 Lumbago with sciatica, right side: Secondary | ICD-10-CM | POA: Diagnosis not present

## 2023-09-05 DIAGNOSIS — M9902 Segmental and somatic dysfunction of thoracic region: Secondary | ICD-10-CM | POA: Diagnosis not present

## 2023-09-05 DIAGNOSIS — M9903 Segmental and somatic dysfunction of lumbar region: Secondary | ICD-10-CM | POA: Diagnosis not present

## 2023-09-05 DIAGNOSIS — M9904 Segmental and somatic dysfunction of sacral region: Secondary | ICD-10-CM | POA: Diagnosis not present

## 2023-09-05 DIAGNOSIS — M9905 Segmental and somatic dysfunction of pelvic region: Secondary | ICD-10-CM | POA: Diagnosis not present

## 2023-09-05 DIAGNOSIS — M5441 Lumbago with sciatica, right side: Secondary | ICD-10-CM | POA: Diagnosis not present

## 2023-09-30 ENCOUNTER — Other Ambulatory Visit: Payer: Self-pay | Admitting: Physician Assistant

## 2023-09-30 DIAGNOSIS — E782 Mixed hyperlipidemia: Secondary | ICD-10-CM

## 2023-09-30 DIAGNOSIS — Z79899 Other long term (current) drug therapy: Secondary | ICD-10-CM

## 2023-10-10 DIAGNOSIS — D239 Other benign neoplasm of skin, unspecified: Secondary | ICD-10-CM | POA: Diagnosis not present

## 2023-10-10 DIAGNOSIS — L821 Other seborrheic keratosis: Secondary | ICD-10-CM | POA: Diagnosis not present

## 2023-10-10 DIAGNOSIS — L570 Actinic keratosis: Secondary | ICD-10-CM | POA: Diagnosis not present

## 2023-10-10 DIAGNOSIS — Z85820 Personal history of malignant melanoma of skin: Secondary | ICD-10-CM | POA: Diagnosis not present

## 2023-10-10 DIAGNOSIS — Z129 Encounter for screening for malignant neoplasm, site unspecified: Secondary | ICD-10-CM | POA: Diagnosis not present

## 2023-10-10 DIAGNOSIS — L820 Inflamed seborrheic keratosis: Secondary | ICD-10-CM | POA: Diagnosis not present

## 2023-10-10 DIAGNOSIS — W908XXS Exposure to other nonionizing radiation, sequela: Secondary | ICD-10-CM | POA: Diagnosis not present

## 2023-10-10 DIAGNOSIS — L578 Other skin changes due to chronic exposure to nonionizing radiation: Secondary | ICD-10-CM | POA: Diagnosis not present

## 2023-10-11 ENCOUNTER — Other Ambulatory Visit: Payer: Self-pay | Admitting: Physician Assistant

## 2023-10-11 DIAGNOSIS — I10 Essential (primary) hypertension: Secondary | ICD-10-CM

## 2023-10-11 DIAGNOSIS — Z79899 Other long term (current) drug therapy: Secondary | ICD-10-CM

## 2023-12-25 ENCOUNTER — Ambulatory Visit (INDEPENDENT_AMBULATORY_CARE_PROVIDER_SITE_OTHER): Admitting: Physician Assistant

## 2023-12-25 ENCOUNTER — Encounter: Payer: Self-pay | Admitting: Physician Assistant

## 2023-12-25 VITALS — BP 127/81 | HR 82 | Temp 98.1°F | Resp 20 | Ht 70.0 in | Wt 183.0 lb

## 2023-12-25 DIAGNOSIS — G479 Sleep disorder, unspecified: Secondary | ICD-10-CM

## 2023-12-25 DIAGNOSIS — I10 Essential (primary) hypertension: Secondary | ICD-10-CM | POA: Diagnosis not present

## 2023-12-25 DIAGNOSIS — E782 Mixed hyperlipidemia: Secondary | ICD-10-CM | POA: Diagnosis not present

## 2023-12-25 DIAGNOSIS — Z79899 Other long term (current) drug therapy: Secondary | ICD-10-CM

## 2023-12-25 DIAGNOSIS — R7303 Prediabetes: Secondary | ICD-10-CM

## 2023-12-25 DIAGNOSIS — Z0000 Encounter for general adult medical examination without abnormal findings: Secondary | ICD-10-CM

## 2023-12-25 DIAGNOSIS — Z23 Encounter for immunization: Secondary | ICD-10-CM | POA: Diagnosis not present

## 2023-12-25 DIAGNOSIS — Z125 Encounter for screening for malignant neoplasm of prostate: Secondary | ICD-10-CM | POA: Diagnosis not present

## 2023-12-25 MED ORDER — LOSARTAN POTASSIUM 50 MG PO TABS: 50.0000 mg | ORAL_TABLET | Freq: Every day | ORAL | 1 refills | Status: AC

## 2023-12-25 MED ORDER — HYDROCHLOROTHIAZIDE 12.5 MG PO TABS: 12.5000 mg | ORAL_TABLET | Freq: Every day | ORAL | 1 refills | Status: AC

## 2023-12-25 NOTE — Patient Instructions (Signed)
 Health Maintenance After Age 71 After age 27, you are at a higher risk for certain long-term diseases and infections as well as injuries from falls. Falls are a major cause of broken bones and head injuries in people who are older than age 73. Getting regular preventive care can help to keep you healthy and well. Preventive care includes getting regular testing and making lifestyle changes as recommended by your health care provider. Talk with your health care provider about: Which screenings and tests you should have. A screening is a test that checks for a disease when you have no symptoms. A diet and exercise plan that is right for you. What should I know about screenings and tests to prevent falls? Screening and testing are the best ways to find a health problem early. Early diagnosis and treatment give you the best chance of managing medical conditions that are common after age 90. Certain conditions and lifestyle choices may make you more likely to have a fall. Your health care provider may recommend: Regular vision checks. Poor vision and conditions such as cataracts can make you more likely to have a fall. If you wear glasses, make sure to get your prescription updated if your vision changes. Medicine review. Work with your health care provider to regularly review all of the medicines you are taking, including over-the-counter medicines. Ask your health care provider about any side effects that may make you more likely to have a fall. Tell your health care provider if any medicines that you take make you feel dizzy or sleepy. Strength and balance checks. Your health care provider may recommend certain tests to check your strength and balance while standing, walking, or changing positions. Foot health exam. Foot pain and numbness, as well as not wearing proper footwear, can make you more likely to have a fall. Screenings, including: Osteoporosis screening. Osteoporosis is a condition that causes  the bones to get weaker and break more easily. Blood pressure screening. Blood pressure changes and medicines to control blood pressure can make you feel dizzy. Depression screening. You may be more likely to have a fall if you have a fear of falling, feel depressed, or feel unable to do activities that you used to do. Alcohol  use screening. Using too much alcohol  can affect your balance and may make you more likely to have a fall. Follow these instructions at home: Lifestyle Do not drink alcohol  if: Your health care provider tells you not to drink. If you drink alcohol : Limit how much you have to: 0-1 drink a day for women. 0-2 drinks a day for men. Know how much alcohol  is in your drink. In the [place] one drink equals one 12 oz bottle of beer (355 mL), one 5 oz glass of wine (148 mL), or one 1 oz glass of hard liquor (44 mL). Do not use any products that contain nicotine or tobacco. These products include cigarettes, chewing tobacco, and vaping devices, such as e-cigarettes. If you need help quitting, ask your health care provider. Activity  Follow a regular exercise program to stay fit. This will help you maintain your balance. Ask your health care provider what types of exercise are appropriate for you. If you need a cane or walker, use it as recommended by your health care provider. Wear supportive shoes that have nonskid soles. Safety  Remove any tripping hazards, such as rugs, cords, and clutter. Install safety equipment such as grab bars in bathrooms and safety rails on stairs. Keep rooms and walkways  well-lit. General instructions Talk with your health care provider about your risks for falling. Tell your health care provider if: You fall. Be sure to tell your health care provider about all falls, even ones that seem minor. You feel dizzy, tiredness (fatigue), or off-balance. Take over-the-counter and prescription medicines only as told by your health care provider. These include  supplements. Eat a healthy diet and maintain a healthy weight. A healthy diet includes low-fat dairy products, low-fat (lean) meats, and fiber from whole grains, beans, and lots of fruits and vegetables. Stay current with your vaccines. Schedule regular health, dental, and eye exams. Summary Having a healthy lifestyle and getting preventive care can help to protect your health and wellness after age 15. Screening and testing are the best way to find a health problem early and help you avoid having a fall. Early diagnosis and treatment give you the best chance for managing medical conditions that are more common for people who are older than age 42. Falls are a major cause of broken bones and head injuries in people who are older than age 64. Take precautions to prevent a fall at home. Work with your health care provider to learn what changes you can make to improve your health and wellness and to prevent falls. This information is not intended to replace advice given to you by your health care provider. Make sure you discuss any questions you have with your health care provider. Document Revised: 08/16/2020 Document Reviewed: 08/16/2020 Elsevier Patient Education  2024 [employer].

## 2023-12-25 NOTE — Progress Notes (Unsigned)
 Complete physical exam  Patient: Daniel Baldwin   DOB: 1950/07/25   73 y.o. Male  MRN: [id number]  Subjective:    Chief Complaint  Patient presents with   Annual Exam    Daniel Baldwin is a 73 y.o. male who presents today for a complete physical exam. He reports consuming a general diet. He has been on a liquid diet for 1 week s/p dental surgery. Home exercise routine includes walking 1 hrs per day. He generally feels well. He reports sleeping fairly well. He is falling asleep ok but reports occasionally waking in the middle of the night. He is able to get back to sleep after an hour or so. He does have additional problems to discuss today.   He is moving to [place]  for the winter on October 1st and he wants to make sure he doesn't need his blood pressure medication doses increased. He states that he has had high blood pressure readings at doctor appointments, but he admits to never checking it at home. He mentioned he will be required by Medicare to get a new PCP in [place]  due to his residency being changed.   He also wanted to talk about how he occasionally wakes up in the middle of the night around 3 am. He is able to fall back asleep after a little while. He wants to prevent this from happening, but he is not interested in medications.    Most recent fall risk assessment:    12/22/2023    8:53 AM  Fall Risk   Falls in the past year? 0     Most recent depression screenings:    12/26/2023    6:18 AM 10/04/2022    9:19 AM  PHQ 2/9 Scores  PHQ - 2 Score 0 0    Vision:Within last year, Dental: No current dental problems and Receives regular dental care, and PSA: Agrees to PSA testing  Patient Active Problem List   Diagnosis Date Noted   Trouble in sleeping 12/26/2023   Pre-diabetes 12/25/2023   Atypical chest pain 10/04/2022   Abnormal EKG 10/04/2022   Elevated liver enzymes 10/13/2020   Impingement syndrome, shoulder, left 12/22/2019   Malignant melanoma (HCC) 12/03/2019    Actinic keratosis 07/23/2019   Elevated PSA 10/07/2018   Ulcerative proctitis with rectal bleeding (HCC) 07/25/2018   Thrombosed external hemorrhoid 07/25/2018   Former smoker 10/10/2017   Elevated fasting glucose 01/03/2017   Diarrhea 03/29/2016   Blood in stool 03/29/2016   Essential hypertension 01/21/2014   Ulnar neuropathy of right upper extremity 01/21/2014   Hyperlipidemia 01/21/2014   Past Medical History:  Diagnosis Date   Hyperlipidemia 01/21/2014   Hypertension    UC (ulcerative colitis) (HCC)    History reviewed. No pertinent surgical history. Family History  Problem Relation Age of Onset   Hypertension Mother    Hypertension Father    Allergies  Allergen Reactions   Ace Inhibitors    Pollen Extract       Patient Care Team: Laysa Kimmey L, PA-C as PCP - General (Family Medicine)   Outpatient Medications Prior to Visit  Medication Sig   atorvastatin  (LIPITOR) 40 MG tablet TAKE 1 TABLET BY MOUTH EVERY DAY   [DISCONTINUED] hydrochlorothiazide  (HYDRODIURIL ) 12.5 MG tablet TAKE 1 TABLET BY MOUTH EVERY DAY   [DISCONTINUED] losartan  (COZAAR ) 50 MG tablet Take 1 tablet (50 mg total) by mouth daily.   [DISCONTINUED] HYDROcodone -acetaminophen  (NORCO/VICODIN) 5-325 MG tablet Take 1 tablet by mouth every 8 (eight) hours as  needed.   [DISCONTINUED] predniSONE  (STERAPRED UNI-PAK 21 TAB) 10 MG (21) TBPK tablet Take by mouth daily. Take 6 tabs by mouth daily  for 2 days, then 5 tabs for 2 days, then 4 tabs for 2 days, then 3 tabs for 2 days, 2 tabs for 2 days, then 1 tab by mouth daily for 2 days   No facility-administered medications prior to visit.    Review of Systems  All other systems reviewed and are negative.         Objective:     BP 127/81 (BP Location: Left Arm, Patient Position: Sitting, Cuff Size: Normal)   Pulse 82   Temp 98.1 F (36.7 C) (Oral)   Resp 20   Ht 5' 10 (1.778 m)   Wt 183 lb (83 kg)   SpO2 100%   BMI 26.26 kg/m  BP Readings  from Last 3 Encounters:  12/25/23 127/81  08/06/23 (!) 172/99  10/04/22 (!) 143/84   Wt Readings from Last 3 Encounters:  12/25/23 183 lb (83 kg)  10/04/22 187 lb (84.8 kg)  09/19/21 190 lb (86.2 kg)      Physical Exam  BP 127/81 (BP Location: Left Arm, Patient Position: Sitting, Cuff Size: Normal)   Pulse 82   Temp 98.1 F (36.7 C) (Oral)   Resp 20   Ht 5' 10 (1.778 m)   Wt 183 lb (83 kg)   SpO2 100%   BMI 26.26 kg/m   General Appearance:    Alert, cooperative, no distress, appears stated age  Head:    Normocephalic, without obvious abnormality, atraumatic  Eyes:    PERRL, conjunctiva/corneas clear, EOM's intact, fundi    benign, both eyes       Ears:    Normal TM's and external ear canals, both ears  Nose:   Nares normal, septum midline, mucosa normal, no drainage    or sinus tenderness  Throat:   Lips, mucosa, and tongue normal; gums normal, missing molars  Neck:   Supple, symmetrical, trachea midline, no adenopathy;       thyroid:  No enlargement/tenderness/nodules; no carotid   bruit or JVD  Back:     Symmetric, no curvature, ROM normal, no CVA tenderness  Lungs:     Clear to auscultation bilaterally, respirations unlabored  Chest wall:    No tenderness or deformity  Heart:    Regular rate and rhythm, S1 and S2 normal, no murmur, rub   or gallop  Abdomen:     Soft, non-tender, bowel sounds active all four quadrants,    no masses, no organomegaly        Extremities:   Extremities normal, atraumatic, no cyanosis or edema  Pulses:   2+ and symmetric all extremities  Skin:   Skin color, texture, turgor normal, no rashes or lesions  Lymph nodes:   Cervical normal  Neurologic:   CNII-XII intact. Normal strength, sensation and reflexes      throughout      Assessment & Plan:    Routine Health Maintenance and Physical Exam  Immunization History  Administered Date(s) Administered   Fluad Quad(high Dose 65+) 01/22/2019, 02/27/2022   INFLUENZA, HIGH DOSE SEASONAL  PF 01/22/2018, 12/25/2023   Influenza,inj,Quad PF,6+ Mos 01/20/2014, 05/26/2016, 01/02/2017   Influenza-Unspecified 01/27/2020   PFIZER Comirnaty(Gray Top)Covid-19 Tri-Sucrose Vaccine 07/16/2020   PFIZER(Purple Top)SARS-COV-2 Vaccination 06/06/2019, 07/04/2019, 01/08/2020   PPD Test 08/05/2019   Pfizer Covid-19 Vaccine Bivalent Booster 28yrs & up 11/08/2020   Pfizer(Comirnaty)Fall Seasonal Vaccine 12 years  and older 02/27/2022, 12/25/2023   Pneumococcal Conjugate-13 10/08/2017   Pneumococcal Polysaccharide-23 10/02/2018   Respiratory Syncytial Virus Vaccine,Recomb Aduvanted(Arexvy) 02/27/2022   Tdap 03/12/2014   Zoster Recombinant(Shingrix) 12/05/2018, 04/11/2019   Zoster, Live 03/12/2014    Health Maintenance  Topic Date Due   Medicare Annual Wellness (AWV)  Never done   Pneumococcal Vaccine: 50+ Years (2 of 2 - PCV) 12/25/2024 (Originally 05/05/2021)   DTaP/Tdap/Td (2 - Td or Tdap) 03/12/2024   COVID-19 Vaccine (8 - Pfizer risk 2024-25 season) 06/23/2024   Colonoscopy  12/13/2033   Influenza Vaccine  Completed   Hepatitis C Screening  Completed   Zoster Vaccines- Shingrix  Completed   HPV VACCINES  Aged Out   Meningococcal B Vaccine  Aged Out    Discussed health benefits of physical activity, and encouraged him to engage in regular exercise appropriate for his age and condition.  SABRADwan was seen today for annual exam.  Diagnoses and all orders for this visit:  Routine physical examination -     Lipid panel -     CMP14+EGFR -     CBC w/Diff/Platelet -     Hemoglobin A1c -     PSA  Essential hypertension -     CMP14+EGFR -     losartan  (COZAAR ) 50 MG tablet; Take 1 tablet (50 mg total) by mouth daily. -     hydrochlorothiazide  (HYDRODIURIL ) 12.5 MG tablet; Take 1 tablet (12.5 mg total) by mouth daily. -     CT CARDIAC SCORING (SELF PAY ONLY)  Mixed hyperlipidemia -     Lipid panel -     CT CARDIAC SCORING (SELF PAY ONLY)  Prostate cancer screening -      PSA  Pre-diabetes -     CMP14+EGFR -     Hemoglobin A1c  Medication management -     hydrochlorothiazide  (HYDRODIURIL ) 12.5 MG tablet; Take 1 tablet (12.5 mg total) by mouth daily.  Immunization due -     Flu vaccine HIGH DOSE PF(Fluzone Trivalent) -     Pfizer Comirnaty Covid -19 Vaccine 32yrs and older  Trouble in sleeping   .SABRAStart a regular exercise program and make sure you are eating a healthy diet Try to eat 4 servings of dairy a day or take a calcium  supplement (500mg  twice a day). No falls PHQ no concerns Fasting labs ordered today BP to goal, continue hydrochlorothiazide  and losartan  Monitor BP at home and can adjust medications accordingly Pt request CT coronary calcium  screening, ordered today PSA ordered today Colonoscopy UTD Flu and covid vaccine given today Will get pneumonia vaccine at later date     Skyeler Scalese, PA-C

## 2023-12-26 ENCOUNTER — Ambulatory Visit: Payer: Self-pay | Admitting: Physician Assistant

## 2023-12-26 ENCOUNTER — Ambulatory Visit (INDEPENDENT_AMBULATORY_CARE_PROVIDER_SITE_OTHER): Payer: Self-pay

## 2023-12-26 DIAGNOSIS — I2510 Atherosclerotic heart disease of native coronary artery without angina pectoris: Secondary | ICD-10-CM

## 2023-12-26 DIAGNOSIS — R918 Other nonspecific abnormal finding of lung field: Secondary | ICD-10-CM

## 2023-12-26 DIAGNOSIS — I10 Essential (primary) hypertension: Secondary | ICD-10-CM

## 2023-12-26 DIAGNOSIS — G479 Sleep disorder, unspecified: Secondary | ICD-10-CM | POA: Insufficient documentation

## 2023-12-26 DIAGNOSIS — R931 Abnormal findings on diagnostic imaging of heart and coronary circulation: Secondary | ICD-10-CM

## 2023-12-26 DIAGNOSIS — E782 Mixed hyperlipidemia: Secondary | ICD-10-CM

## 2023-12-26 LAB — CMP14+EGFR
ALT: 58 IU/L — ABNORMAL HIGH (ref 0–44)
AST: 50 IU/L — ABNORMAL HIGH (ref 0–40)
Albumin: 4.6 g/dL (ref 3.8–4.8)
Alkaline Phosphatase: 65 IU/L (ref 47–123)
BUN/Creatinine Ratio: 15 (ref 10–24)
BUN: 15 mg/dL (ref 8–27)
Bilirubin Total: 0.9 mg/dL (ref 0.0–1.2)
CO2: 22 mmol/L (ref 20–29)
Calcium: 10.2 mg/dL (ref 8.6–10.2)
Chloride: 98 mmol/L (ref 96–106)
Creatinine, Ser: 0.99 mg/dL (ref 0.76–1.27)
Globulin, Total: 2.3 g/dL (ref 1.5–4.5)
Glucose: 97 mg/dL (ref 70–99)
Potassium: 4.6 mmol/L (ref 3.5–5.2)
Sodium: 138 mmol/L (ref 134–144)
Total Protein: 6.9 g/dL (ref 6.0–8.5)
eGFR: 81 mL/min/1.73 (ref >59)

## 2023-12-26 LAB — CBC WITH DIFFERENTIAL/PLATELET
Basophils Absolute: 0.1 x10E3/uL (ref 0.0–0.2)
Basos: 1 %
EOS (ABSOLUTE): 0.3 x10E3/uL (ref 0.0–0.4)
Eos: 4 %
Hematocrit: 47.6 % (ref 37.5–51.0)
Hemoglobin: 15.6 g/dL (ref 13.0–17.7)
Immature Grans (Abs): 0 x10E3/uL (ref 0.0–0.1)
Immature Granulocytes: 0 %
Lymphocytes Absolute: 1.4 x10E3/uL (ref 0.7–3.1)
Lymphs: 19 %
MCH: 31 pg (ref 26.6–33.0)
MCHC: 32.8 g/dL (ref 31.5–35.7)
MCV: 94 fL (ref 79–97)
Monocytes Absolute: 1.1 x10E3/uL — ABNORMAL HIGH (ref 0.1–0.9)
Monocytes: 15 %
Neutrophils Absolute: 4.4 x10E3/uL (ref 1.4–7.0)
Neutrophils: 60 %
Platelets: 290 x10E3/uL (ref 150–450)
RBC: 5.04 x10E6/uL (ref 4.14–5.80)
RDW: 11.7 % (ref 11.6–15.4)
WBC: 7.3 x10E3/uL (ref 3.4–10.8)

## 2023-12-26 LAB — LIPID PANEL
Chol/HDL Ratio: 3.8 ratio (ref 0.0–5.0)
Cholesterol, Total: 135 mg/dL (ref 100–199)
HDL: 36 mg/dL — ABNORMAL LOW (ref >39)
LDL Chol Calc (NIH): 74 mg/dL (ref 0–99)
Triglycerides: 143 mg/dL (ref 0–149)
VLDL Cholesterol Cal: 25 mg/dL (ref 5–40)

## 2023-12-26 LAB — HEMOGLOBIN A1C
Est. average glucose Bld gHb Est-mCnc: 120 mg/dL
Hgb A1c MFr Bld: 5.8 % — ABNORMAL HIGH (ref 4.8–5.6)

## 2023-12-26 LAB — PSA: Prostate Specific Ag, Serum: 1.7 ng/mL (ref 0.0–4.0)

## 2023-12-26 NOTE — Progress Notes (Signed)
 Your CT calcium  score is elevated. We need to get you into cardiologist for more evaluation and to look at the coronary arteries that have scored the highest with calcium  score. Make sure to take your lipitor daily and I would add ASA 81mg  daily if does not bother your stomach.

## 2023-12-26 NOTE — Progress Notes (Signed)
 Arley,   Prostate normal range.  A1C the same as 1 year ago in pre-diabetes range.  Liver enzymes up some but u/s ordered by gastroenterology.  TG better.  HDL not to goal LDL looks pretty good.

## 2023-12-31 DIAGNOSIS — R918 Other nonspecific abnormal finding of lung field: Secondary | ICD-10-CM | POA: Insufficient documentation

## 2023-12-31 NOTE — Progress Notes (Signed)
 2 pulmonary nodules to keep watch on. Could recheck CT in 12 months to confirm stability.

## 2024-02-19 ENCOUNTER — Encounter (HOSPITAL_BASED_OUTPATIENT_CLINIC_OR_DEPARTMENT_OTHER): Payer: Self-pay

## 2024-03-09 ENCOUNTER — Other Ambulatory Visit: Payer: Self-pay | Admitting: Physician Assistant

## 2024-03-09 DIAGNOSIS — E782 Mixed hyperlipidemia: Secondary | ICD-10-CM

## 2024-03-09 DIAGNOSIS — Z79899 Other long term (current) drug therapy: Secondary | ICD-10-CM

## 2024-03-22 ENCOUNTER — Encounter: Payer: Self-pay | Admitting: Physician Assistant

## 2024-03-22 DIAGNOSIS — E782 Mixed hyperlipidemia: Secondary | ICD-10-CM

## 2024-03-22 DIAGNOSIS — I10 Essential (primary) hypertension: Secondary | ICD-10-CM

## 2024-03-24 DIAGNOSIS — R931 Abnormal findings on diagnostic imaging of heart and coronary circulation: Secondary | ICD-10-CM | POA: Insufficient documentation

## 2024-04-22 NOTE — Progress Notes (Signed)
 " Cardiology Office Note:  .   Date:  04/28/2024 ID:  Daniel Baldwin, DOB 1950-09-26, MRN [id number] PCP: Daniel Baldwin Daniel Baldwin Cardiologist:  None   Patient Profile: .      PMH Coronary artery disease CT Calcium  score 12/26/23 CAC Score 979 (83rd percentile) LM 0, LAD 452, LCx 179, RCA 348 Aortic atherosclerosis Pulmonary nodules Hyperlipidemia Hypertension Prediabetes Elevated liver enzymes     History of Present Illness: .   Discussed the use of AI scribe software for clinical note transcription with the patient, who gave verbal consent to proceed.  History of Present Illness Daniel Baldwin is a very pleasant 74 year old male who presents for evaluation of elevated coronary artery calcium  score. Approximately two years ago, an EKG showed T wave abnormalities. He has not had prior stress testing or echo. Notes brief, mild chest discomfort once a year described as a small pinch or push, without clear triggers. History of high cholesterol, has taken atorvastatin  for years. Family history of high cholesterol but no known family heart attacks or strokes. He walks three to five miles three times a week without chest pain, shortness of breath, palpitations, lightheadedness, or exercise intolerance. His diet is generally lean meats and salads. His blood pressure is often elevated in medical settings and sometimes at home.  He recently moved from [place] to [place]  but is here periodically for appointments.   Family history: His family history includes Arthritis in his mother; Cancer in his brother and sister; Diabetes in his mother; Hyperlipidemia in his brother; Hypertension in his father and mother.    ASCVD Risk Score: The 10-year ASCVD risk score (Arnett DK, et al., 2019) is: 24.6%   Values used to calculate the score:     Age: 21 years     Clinically relevant sex: Male     Is Non-Hispanic African American: No     Diabetic: No     Tobacco smoker: No      Systolic Blood Pressure: 140 mmHg     Is BP treated: Yes     HDL Cholesterol: 36 mg/dL     Total Cholesterol: 135 mg/dL   Diet: Cooks often Salads, chicken, occasional red meat Olive oil Lots of fruits and vegetables  Activity: Recently moved to [place]  Walks 3-5 miles 3 x per week   No results found for: LIPOA   ROS: See HPI       Studies Reviewed: SABRA   EKG Interpretation Date/Time:  Monday April 28 2024 10:11:31 EST Ventricular Rate:  82 PR Interval:  176 QRS Duration:  94 QT Interval:  376 QTC Calculation: 439 R Axis:   56  Text Interpretation: Normal sinus rhythm Abnormal QRS-T angle, consider primary T wave abnormality No acute changes from previous tracing 04/2022 Confirmed by Percy Browning [phone]) on 04/28/2024 10:34:33 AM      Risk Assessment/Calculations:     HYPERTENSION CONTROL Vitals:   04/28/24 1008 04/28/24 1708  BP: (!) 150/82 (!) 140/90    The patient's blood pressure is elevated above target today.  In order to address the patient's elevated BP: Blood pressure will be monitored at home to determine if medication changes need to be made.; Follow up with primary care provider for management.          Physical Exam:   VS: BP (!) 140/90   Pulse 82   Ht 5' 10 (1.778 m)   Wt 193 lb 14.4 oz (88 kg)   SpO2  98%   BMI 27.82 kg/m   Wt Readings from Last 3 Encounters:  04/28/24 193 lb 14.4 oz (88 kg)  12/25/23 183 lb (83 kg)  10/04/22 187 lb (84.8 kg)     GEN: Well nourished, well developed in no acute distress NECK: No JVD; No carotid bruits CARDIAC: RRR, no murmurs, rubs, gallops RESPIRATORY:  Clear to auscultation without rales, wheezing or rhonchi  ABDOMEN: Soft, non-tender, non-distended EXTREMITIES:  No edema; No deformity     ASSESSMENT AND PLAN: .    Assessment & Plan Coronary artery disease  Cardiac risk Abnormal EKG CT calcium  score 979 (83rd percentile) with significant calcification in LCx, RCA, and LAD. He would like  to proceed with additional ischemia evaluation due to concerns about significant calcification and friend who had a lower calcium  score but had an MI. He is traveling back to [facility] in a week and hopes to complete testing prior to returning to [facility]. Asks about possibility of testing in [place]. He is active with regular walking 3-5 miles and denies chest pain, dyspnea, or other symptoms concerning for angina.  EKG reveals T wave abnormality consistent with prior tracing in 2024.  BP is elevated.  History of hyper lipidemia with lipids not at goal on atorvastatin  40 mg daily.  - Coronary CTA for ischemia evaluation and mapping of coronary artery - Lopressor  100 mg 2 hours prior to CT - BMET to ensure stable renal function prior to CT - Recommend additional lipid-lowering therapy but he does not wish to start at this time - Continue aspirin 81 mg daily, atorvastatin , losartan , hydrochlorothiazide  - Heart healthy diet avoiding processed foods, saturated fat, sugar, and other simple carbohydrates encouraged - Be as physically active as possible every day and aim for at least 150 minutes of moderate intensity exercise each week  Hyperlipidemia LDL goal < 55 Lipid panel completed 12/25/2023 with total cholesterol 135, HDL 36, LDL 74, and triglycerides 856.  Advised goal LDL 55 or lower. Additional lipid-lowering options were discussed, including ezetimibe, PCSK9 inhibitors, and bempedoic acid.  He reports he does not want to add additional lipid-lowering therapy at this time.   - Continue atorvastatin  40 mg daily - Heart healthy diet and regular exercise encouraged  Hypertension   Blood pressure is 140/90 mmHg in the office, with similar home readings. Possibility of additional anti-hypertensive therapy recommended. He does not make any changes to antihypertensive therapy at this time.  Renal function stable on labs completed 12/25/2023.  Was planning to recheck renal function in the setting of ordering coronary CT,  however he would like to hold off. - Recommend home monitoring for goal BP 130/80 or lower - Continue hydrochlorothiazide  and losartan       Dispo: TBD based on test results/pt reported at discharge that he is considering postponing CT until May 2026  Signed, Rosaline Bane, NP-C "

## 2024-04-28 ENCOUNTER — Ambulatory Visit (HOSPITAL_BASED_OUTPATIENT_CLINIC_OR_DEPARTMENT_OTHER): Admitting: Nurse Practitioner

## 2024-04-28 ENCOUNTER — Encounter (HOSPITAL_BASED_OUTPATIENT_CLINIC_OR_DEPARTMENT_OTHER): Payer: Self-pay | Admitting: Nurse Practitioner

## 2024-04-28 VITALS — BP 140/90 | HR 82 | Ht 70.0 in | Wt 193.9 lb

## 2024-04-28 DIAGNOSIS — Z7189 Other specified counseling: Secondary | ICD-10-CM

## 2024-04-28 DIAGNOSIS — I2510 Atherosclerotic heart disease of native coronary artery without angina pectoris: Secondary | ICD-10-CM | POA: Diagnosis not present

## 2024-04-28 DIAGNOSIS — R931 Abnormal findings on diagnostic imaging of heart and coronary circulation: Secondary | ICD-10-CM

## 2024-04-28 DIAGNOSIS — I10 Essential (primary) hypertension: Secondary | ICD-10-CM | POA: Diagnosis not present

## 2024-04-28 DIAGNOSIS — R9431 Abnormal electrocardiogram [ECG] [EKG]: Secondary | ICD-10-CM | POA: Diagnosis not present

## 2024-04-28 DIAGNOSIS — E785 Hyperlipidemia, unspecified: Secondary | ICD-10-CM

## 2024-04-28 MED ORDER — METOPROLOL TARTRATE 100 MG PO TABS: 100.0000 mg | ORAL_TABLET | Freq: Once | ORAL | 0 refills | Status: DC

## 2024-04-28 NOTE — Patient Instructions (Signed)
 Medication Instructions:   Your physician recommends that you continue on your current medications as directed. Please refer to the Current Medication list given to you today.   *If you need a refill on your cardiac medications before your next appointment, please call your pharmacy*  Lab Work:  TODAY!!!! BMET  If you have labs (blood work) drawn today and your tests are completely normal, you will receive your results only by: MyChart Message (if you have MyChart) OR A paper copy in the mail If you have any lab test that is abnormal or we need to change your treatment, we will call you to review the results.  Testing/Procedures:    Your cardiac CT will be scheduled at one of the below locations:   [facility] [address]  [place], [place] [address]   If scheduled at the [facility] at [address], please enter the parking lot using the [address] entrance and use the FREE valet service at the patient drop-off area. Enter the building and check-in with registration on the main floor.  Please follow these instructions carefully (unless otherwise directed):  An IV will be required for this test and Nitroglycerin will be given.  Hold all erectile dysfunction medications at least 3 days (72 hrs) prior to test. (Ie viagra, cialis, sildenafil, tadalafil, etc)   On the Night Before the Test: Be sure to Drink plenty of water. Do not consume any caffeinated/decaffeinated beverages or chocolate 12 hours prior to your test. Do not take any antihistamines 12 hours prior to your test.   On the Day of the Test: Drink plenty of water until 1 hour prior to the test. Do not eat any food 1 hour prior to test. You may take your regular medications prior to the test.  Take metoprolol  (Lopressor ) (ONE TABLET BY MOUTH ( 100 MG) two hours prior to test. If you take Hydrochlorothiazide   please HOLD on the morning of the test.      After the  Test: Drink plenty of water. After receiving IV contrast, you may experience a mild flushed feeling. This is normal. On occasion, you may experience a mild rash up to 24 hours after the test. This is not dangerous. If this occurs, you can take Benadryl 25 mg, Zyrtec, Claritin, or Allegra  and increase your fluid intake. (Patients taking Tikosyn should avoid Benadryl, and may take Zyrtec, Claritin, or Allegra ) If you experience trouble breathing, this can be serious. If it is severe call 911 IMMEDIATELY. If it is mild, please call our office.  We will call to schedule your test 2-4 weeks out understanding that some insurance companies will need an authorization prior to the service being performed.   For more information and frequently asked questions, please visit our website : [email or web address]  For non-scheduling related questions, please contact the cardiac imaging nurse navigator should you have any questions/concerns: Cardiac Imaging Nurse Navigators Direct Office Dial: [phone]   For scheduling needs, including cancellations and rescheduling, please call Brittany, [phone].   Follow-Up: At [facility], you and your health needs are our priority.  As part of our continuing mission to provide you with exceptional heart care, our providers are all part of one team.  This team includes your primary Cardiologist (physician) and Advanced Practice Providers or APPs (Physician Assistants and Nurse Practitioners) who all work together to provide you with the care you need, when you need it.  Your next appointment:   To  be determined based on test results.    Provider:   Rosaline Bane, NP    We recommend signing up for the patient portal called MyChart.  Sign up information is provided on this After Visit Summary.  MyChart is used to connect with patients for Virtual Visits (Telemedicine).  Patients are able to view lab/test results, encounter notes,  upcoming appointments, etc.  Non-urgent messages can be sent to your provider as well.   To learn more about what you can do with MyChart, go to [email or web address].
